# Patient Record
Sex: Male | Born: 1955
Health system: Southern US, Community
[De-identification: ages and names within clinical notes are randomized; demographics above are authoritative.]

## PROBLEM LIST (undated history)

## (undated) DIAGNOSIS — G4730 Sleep apnea, unspecified: Secondary | ICD-10-CM

## (undated) DIAGNOSIS — M549 Dorsalgia, unspecified: Secondary | ICD-10-CM

## (undated) DIAGNOSIS — F419 Anxiety disorder, unspecified: Secondary | ICD-10-CM

## (undated) HISTORY — PX: NOSE SURGERY: SHX723

## (undated) HISTORY — PX: NASAL SEPTUM SURGERY: SHX37

## (undated) HISTORY — DX: Anxiety disorder, unspecified: F41.9

## (undated) HISTORY — DX: Dorsalgia, unspecified: M54.9

## (undated) HISTORY — DX: Sleep apnea, unspecified: G47.30

---

## 2005-05-30 ENCOUNTER — Encounter: Admission: RE | Admit: 2005-05-30 | Discharge: 2005-05-30 | Payer: Self-pay | Admitting: Occupational Medicine

## 2007-07-31 ENCOUNTER — Ambulatory Visit (HOSPITAL_COMMUNITY): Admission: RE | Admit: 2007-07-31 | Discharge: 2007-07-31 | Payer: Self-pay | Admitting: General Surgery

## 2011-01-22 NOTE — Op Note (Signed)
Willie Ortega, Willie Ortega              ACCOUNT NO.:  [id number]   MEDICAL RECORD NO.:  [id number]          PATIENT TYPE:  AMB   LOCATION:  DAY                           FACILITY:  [facility]   PHYSICIAN:  Dalia Heading, M.D.  DATE OF BIRTH:  [birth year]   DATE OF PROCEDURE:  07/31/2007  DATE OF DISCHARGE:                               OPERATIVE REPORT   PREOPERATIVE DIAGNOSIS:  Need for screening colonoscopy.   POSTOPERATIVE DIAGNOSIS:  Need for screening colonoscopy, sigmoid  diverticulosis.   PROCEDURE:  Colonoscopy.   SURGEON:  Dalia Heading, M.D.   ANESTHESIA:  Demerol 50 mg IV, Versed 4 mg IV.   INDICATIONS:  The patient is a 55 year old white male who is referred  for endoscopic evaluation.  The risks and benefits of the procedure,  including bleeding and perforation, were fully explained to the patient  who gave informed consent.   PROCEDURE NOTE:  The patient was placed in the left lateral decubitus  position after monitored anesthesia care was given.  Surgical site  confirmation was performed.  A rectal examination was unremarkable.   The endoscope was advanced to the cecum without difficulty.  Confirmation of placement to the cecum was done using transabdominal  palpation and landmarks.  The bowel preparation was adequate.  The  cecum, ascending colon, transverse colon, and descending colon regions  were all within normal limits.  No abnormal lesions were noted.  Diverticulosis was noted within the sigmoid colon.  There was no  evidence of previous infection or stricture formation.  The rectum was  within normal limits.  No abnormal lesions were noted.  On retroflexion  of the endoscope, the dentate line was inspected and noted to be within  normal limits.  The colonoscope was returned to the neutral position,  and all air was evacuated from the colon and rectum prior to removal of  the endoscope.   The patient tolerated the procedure well and was transferred back to  day  surgery in stable condition.   COMPLICATIONS:  None.   SPECIMENS:  None.   ESTIMATED BLOOD LOSS:  None.   RECOMMENDATIONS:  A followup colonoscopy is recommended in 10 years for  screening purposes.      Dalia Heading, M.D.  Electronically Signed     MAJ/MEDQ  D:  07/31/2007  T:  07/31/2007  Job:  [id number]   cc:   Patrica Duel, M.D.  Fax: [phone]

## 2011-01-22 NOTE — H&P (Signed)
Willie Ortega, Willie Ortega              ACCOUNT NO.:  [id number]   MEDICAL RECORD NO.:  [id number]          PATIENT TYPE:  AMB   LOCATION:  DAY                           FACILITY:  [facility]   PHYSICIAN:  Dalia Heading, M.D.  DATE OF BIRTH:  05-18-1956   DATE OF ADMISSION:  DATE OF DISCHARGE:  LH                              HISTORY & PHYSICAL   CHIEF COMPLAINT:  Need for screening colonoscopy.   HISTORY OF PRESENT ILLNESS:  Patient is a 55 year old white male who is  referred for endoscopic evaluation.  He needs colonoscopy for screening  purposes.  No abdominal pain, weight loss, nausea, vomiting, diarrhea,  constipation, melena, or hematochezia have been noted.  He has never had  a colonoscopy.  There is no family history of colon carcinoma.   PAST MEDICAL HISTORY:  Unremarkable.   PAST SURGICAL HISTORY:  Unremarkable.   CURRENT MEDICATIONS:  Simvastatin.   ALLERGIES:  NO KNOWN DRUG ALLERGIES.   REVIEW OF SYSTEMS:  Noncontributory.   PHYSICAL EXAMINATION:  Patient is a well-developed, well-nourished white  male in no acute distress.  LUNGS:  Clear to auscultation with equal breath sounds bilaterally.  HEART:  Examination reveals a regular rate and rhythm without S3, S4, or  murmurs.  ABDOMEN:  Soft, nontender, nondistended.  No hepatosplenomegaly or  masses are noted.  RECTAL:  Examination was deferred to the procedure.   IMPRESSION:  Need for screening colonoscopy.   PLAN:  The patient is scheduled for a colonoscopy on July 31, 2007.  The risks and benefits of the procedure including bleeding and  perforation were fully explained to the patient, gave informed consent.      Dalia Heading, M.D.  Electronically Signed     MAJ/MEDQ  D:  07/23/2007  T:  07/24/2007  Job:  [id number]   cc:   Dalia Heading, M.D.  Fax: [phone]   Patrica Duel, M.D.  Fax: [phone]

## 2015-01-06 ENCOUNTER — Ambulatory Visit (INDEPENDENT_AMBULATORY_CARE_PROVIDER_SITE_OTHER): Payer: BLUE CROSS/BLUE SHIELD | Admitting: Neurology

## 2015-01-06 ENCOUNTER — Encounter: Payer: Self-pay | Admitting: Neurology

## 2015-01-06 VITALS — BP 118/70 | HR 72 | Resp 16 | Ht 74.0 in | Wt 268.0 lb

## 2015-01-06 DIAGNOSIS — E669 Obesity, unspecified: Secondary | ICD-10-CM

## 2015-01-06 DIAGNOSIS — G4733 Obstructive sleep apnea (adult) (pediatric): Secondary | ICD-10-CM

## 2015-01-06 DIAGNOSIS — G4719 Other hypersomnia: Secondary | ICD-10-CM | POA: Diagnosis not present

## 2015-01-06 NOTE — Progress Notes (Signed)
Subjective:    Patient ID: Willie Ortega is a 59 y.o. male.  HPI     Huston Foley, MD, PhD [facility] [address] [address] [place], [place] [address]  Dear Sharlet Salina,    I saw your patient, Willie Ortega, upon your kind request in my neurologic clinic today for initial consultation of his sleep disorder, in particular, reevaluation of his prior obstructive sleep apnea diagnosis, in the context of weight gain, and excessive daytime somnolence reported. The patient is unaccompanied today. As you know, Willie Ortega is a 59 year old right-handed gentleman with an underlying medical history of anxiety, low back pain and obesity, who was diagnosed with obstructive sleep apnea several years ago. His last sleep study was about 12 years ago and he was advised to use CPAP at the time. He has been on CPAP therapy for about 12 years now. His machine is old. He has a tendency to take his mask off in the early morning hours and has not been very good about putting it back on. He does use it every night as far as putting the mask on. For the longest time he has been using a nasal mask but some 4 or 5 months ago he started using a nasal pillows mask which he prefers. He reports a pressure of 16 cm. As far as he can remember he was diagnosed with significant obstructive sleep apnea. I do not have test results from his previous sleep study available for review today. He reports an approximate 15 pound weight gain since his original sleep study. He would like to get an updated machine and updated settings. He does endorse daytime somnolence with an Epworth sleepiness score of 14 out of 24 today. He denies restless leg symptoms but does have occasional leg cramps at night. He may not be drinking enough water he endorses. He Drinks coffee occasionally maybe once or twice per week. He does not smoke. He drinks alcohol occasionally. He likes to nap if he can. Bedtime usually is 9:51  PM he falls asleep fairly quickly. He lives alone and sleeps alone typically. His wake time is 5 AM and he does not always wake up rested. He denies morning headaches or significant nocturia. He is not aware of any family history of RLS or obstructive sleep apnea. 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 He gets his supplies from [employer] in [place].  Some 20 years ago he had nasal surgery. From what I can tell, he had septum deviation repair and inferior turbinate reduction. He does suffer from allergies and nasal congestion. He takes Claritin occasionally. He has used nasal saline rinses. He denies using Afrin.   His Past Medical History Is Significant For: Past Medical History  Diagnosis Date  . Anxiety   . Sleep apnea   . Back pain     His Past Surgical History Is Significant For: Past Surgical History  Procedure Laterality Date  . Nose surgery      His Family History Is Significant For: Family History  Problem Relation Age of Onset  . Hypertension Mother   . Hyperlipidemia Mother   . Thyroid cancer Sister     His Social History Is Significant For: History   Social History  . Marital Status: Single    Spouse Name: N/A  . Number of Children: N/A  . Years of Education: N/A   Social History Main Topics  . Smoking status:  Never Smoker   . Smokeless tobacco: Not on file  . Alcohol Use: 0.0 oz/week    0 Standard drinks or equivalent per week     Comment: 3-4 drinks a week   . Drug Use: No  . Sexual Activity: Not on file   Other Topics Concern  . None   Social History Narrative   1-2  Caffeine drinks a week    His Allergies Are:  No Known Allergies:   His Current Medications Are:  Outpatient Encounter Prescriptions as of 01/06/2015  Medication Sig  . aspirin EC 81 MG tablet Take 81 mg by mouth daily.  . diazepam (VALIUM) 10 MG tablet Take 10 mg by mouth 3 (three) times daily as needed.  :  Review of Systems:   Out of a complete 14 point review of systems, all are reviewed and negative with the exception of these symptoms as listed below:   Review of Systems  Respiratory:       Snoring   Neurological:       Sleepiness, Last sleep study done 12 years ago- diagnosed with sleep apnea   Psychiatric/Behavioral:       Not enough sleep     Objective:  Neurologic Exam  Physical Exam Physical Examination:   Filed Vitals:   01/06/15 0933  BP: 118/70  Pulse: 72  Resp: 16    General Examination: The patient is a very pleasant 59 y.o. male in no acute distress. He appears well-developed and well-nourished and well groomed.   HEENT: Normocephalic, atraumatic, pupils are equal, round and reactive to light and accommodation. Funduscopic exam is normal with sharp disc margins noted. Extraocular tracking is good without limitation to gaze excursion or nystagmus noted. Normal smooth pursuit is noted. Hearing is grossly intact. Tympanic membranes are clear bilaterally. Face is symmetric with normal facial animation and normal facial sensation. Speech is clear with no dysarthria noted. There is no hypophonia. There is no lip, neck/head, jaw or voice tremor. Neck is supple with full range of passive and active motion. There are no carotid bruits on auscultation. Oropharynx exam reveals: mild mouth dryness, good dental hygiene and moderate airway crowding, due to redundant soft palate and wider tongue. Mallampati is class II. Tongue protrudes centrally and palate elevates symmetrically. Tonsils are small. Neck size is 18.75 inches. He has a Mild overbite. Nasal inspection reveals no significant nasal mucosal bogginess or redness and no septal deviation, but he does have narrow nasal passages bilaterally.   Chest: Clear to auscultation without wheezing, rhonchi or crackles noted.  Heart: S1+S2+0, regular and normal without murmurs, rubs or gallops noted.   Abdomen: Soft, non-tender and non-distended with normal  bowel sounds appreciated on auscultation.  Extremities: There is trace pitting edema in the distal lower extremities bilaterally. Pedal pulses are intact.  Skin: Warm and dry without trophic changes noted. There are no varicose veins.  Musculoskeletal: exam reveals no obvious joint deformities, tenderness or joint swelling or erythema.   Neurologically:  Mental status: The patient is awake, alert and oriented in all 4 spheres. His immediate and remote memory, attention, language skills and fund of knowledge are appropriate. There is no evidence of aphasia, agnosia, apraxia or anomia. Speech is clear with normal prosody and enunciation. Thought process is linear. Mood is normal and affect is normal.  Cranial nerves II - XII are as described above under HEENT exam. In addition: shoulder shrug is normal with equal shoulder height noted. Motor exam: Normal bulk, strength and  tone is noted. There is no drift, tremor or rebound. Romberg is negative. Reflexes are 2+ throughout. Babinski: Toes are flexor bilaterally. Fine motor skills and coordination: intact with normal finger taps, normal hand movements, normal rapid alternating patting, normal foot taps and normal foot agility.  Cerebellar testing: No dysmetria or intention tremor on finger to nose testing. Heel to shin is unremarkable bilaterally. There is no truncal or gait ataxia.  Sensory exam: intact to light touch, pinprick, vibration, temperature sense in the upper and lower extremities.  Gait, station and balance: He stands easily. No veering to one side is noted. No leaning to one side is noted. Posture is age-appropriate and stance is narrow based. Gait shows normal stride length and normal pace. No problems turning are noted. He turns en bloc. Tandem walk is unremarkable.   Assessment and Plan:  In summary, Willie Guileommy D Talmadge [place] is a very pleasant 59 y.o.-year old male with an underlying medical history of anxiety, low back pain and obesity, who  was diagnosed with obstructive sleep apnea several years ago. He reports interim weight gain and significant daytime somnolence and does not always keep his mask on. He would benefit from reevaluation as his study was over 10 years ago and his machine is over 58 years old. He would need a new machine as well at this time. I had a long chat with the patient about my findings and the diagnosis of OSA, its prognosis and treatment options. We talked about medical treatments, surgical interventions and non-pharmacological approaches. I explained in particular the risks and ramifications of untreated moderate to severe OSA, especially with respect to developing cardiovascular disease down the Road, including congestive heart failure, difficult to treat hypertension, cardiac arrhythmias, or stroke. Even type 2 diabetes has, in part, been linked to untreated OSA. Symptoms of untreated OSA include daytime sleepiness, memory problems, mood irritability and mood disorder such as depression and anxiety, lack of energy, as well as recurrent headaches, especially morning headaches. We talked about trying to maintain a healthy lifestyle in general, as well as the importance of weight control. I encouraged the patient to eat healthy, exercise daily and keep well hydrated, to keep a scheduled bedtime and wake time routine, to not skip any meals and eat healthy snacks in between meals. I advised the patient not to drive when feeling sleepy. I recommended the following at this time: sleep study with potential positive airway pressure titration. (We will score hypopneas at 3% and split the sleep study into diagnostic and treatment portion, if the estimated. 2 hour AHI is >15/h).   I explained the sleep test procedure to the patient and also outlined possible surgical and non-surgical treatment options of OSA, including the use of a custom-made dental device (which would require a referral to a specialist dentist or oral surgeon),  upper airway surgical options, such as pillar implants, radiofrequency surgery, tongue base surgery, and UPPP (which would involve a referral to an ENT surgeon). Rarely, jaw surgery such as mandibular advancement may be considered.  I also explained the CPAP treatment option to the patient, who indicated that he would be willing to continue with CPAP therapy the need arises. It sounds like he required a fairly high pressure at the time. In light of his weight gain there is a chance that he may require an even higher pressure at this point. He may benefit from bilevel positive airway pressure in that instance. I explained the importance of being compliant with PAP treatment, not  only for insurance purposes but primarily to improve His symptoms, and for the patient's long term health benefit, including to reduce His cardiovascular risks. I answered all his questions today and the patient was in agreement. I would like to see him back after the sleep study is completed and encouraged him to call with any interim questions, concerns, problems or updates.   Thank you very much for allowing me to participate in the care of this nice patient. If I can be of any further assistance to you please do not hesitate to call me at [phone].  Sincerely,   Star Age, MD, PhD

## 2015-01-06 NOTE — Patient Instructions (Signed)

## 2015-01-19 ENCOUNTER — Ambulatory Visit (INDEPENDENT_AMBULATORY_CARE_PROVIDER_SITE_OTHER): Payer: BLUE CROSS/BLUE SHIELD | Admitting: Neurology

## 2015-01-19 VITALS — BP 126/76 | HR 75 | Ht 74.0 in | Wt 268.0 lb

## 2015-01-19 DIAGNOSIS — G4761 Periodic limb movement disorder: Secondary | ICD-10-CM

## 2015-01-19 DIAGNOSIS — G4733 Obstructive sleep apnea (adult) (pediatric): Secondary | ICD-10-CM | POA: Diagnosis not present

## 2015-01-19 DIAGNOSIS — G479 Sleep disorder, unspecified: Secondary | ICD-10-CM

## 2015-01-19 DIAGNOSIS — G4710 Hypersomnia, unspecified: Secondary | ICD-10-CM

## 2015-01-19 DIAGNOSIS — G4730 Sleep apnea, unspecified: Secondary | ICD-10-CM

## 2015-01-20 NOTE — Sleep Study (Signed)
See scanned document in Encounters tab 

## 2015-01-31 ENCOUNTER — Telehealth: Payer: Self-pay | Admitting: Neurology

## 2015-01-31 DIAGNOSIS — G4733 Obstructive sleep apnea (adult) (pediatric): Secondary | ICD-10-CM

## 2015-01-31 NOTE — Telephone Encounter (Signed)
Lafonda MossesDiana:   Please call and notify patient that the recent sleep study confirmed the diagnosis of severe OSA. He did well with CPAP during the study with significant improvement of the respiratory events. Therefore, I would like re-start/keep the patient on CPAP therapy at home by prescribing a NEW CPAP machine for home use. I placed the order in the chart. The patient will need a follow up appointment with me in 8 to 10 weeks post set up that has to be scheduled; please go ahead and schedule while you have the patient on the phone and make sure patient understands the importance of keeping this window for the FU appointment, as it is often an insurance requirement and failing to adhere to this may result in losing coverage for sleep apnea treatment. 15 min follow-up should suffice, unless there is a 30 min FU slot available.  Please re-enforce the importance of compliance with treatment and the need for us to monitor compliance data - again an insurance requirement and good feedback for the patient as far as how they are doing.  Also remind patient, that any upcoming CPAP machine or mask issues, should be first addressed with the DME company. Please ask if patient has a preference regarding DME company.  Please arrange for CPAP set up at home through a DME company of patient's choice - once you have spoken to the patient - and faxed/routed report to PCP and referring MD (if other than PCP), you can close this encounter, thanks,   He may have an existing DME company - Ins: [employer]  Huston FoleySaima Disaya Walt, MD, PhD [facility] ([facility])

## 2015-02-01 NOTE — Telephone Encounter (Signed)
I spoke to patient. He is aware of results. He would like to proceed with getting new CPAP. He requested [facility]. He will call back to make f/u appt. I will also send patient a letter to remind him to make appt and explain the importance of compliance.

## 2015-04-10 ENCOUNTER — Encounter: Payer: Self-pay | Admitting: Neurology

## 2015-04-10 ENCOUNTER — Ambulatory Visit (INDEPENDENT_AMBULATORY_CARE_PROVIDER_SITE_OTHER): Payer: BLUE CROSS/BLUE SHIELD | Admitting: Neurology

## 2015-04-10 ENCOUNTER — Telehealth: Payer: Self-pay | Admitting: Neurology

## 2015-04-10 VITALS — BP 124/80 | HR 78 | Resp 18 | Ht 74.0 in | Wt 271.0 lb

## 2015-04-10 DIAGNOSIS — Z9989 Dependence on other enabling machines and devices: Secondary | ICD-10-CM

## 2015-04-10 DIAGNOSIS — E669 Obesity, unspecified: Secondary | ICD-10-CM | POA: Diagnosis not present

## 2015-04-10 DIAGNOSIS — G4733 Obstructive sleep apnea (adult) (pediatric): Secondary | ICD-10-CM

## 2015-04-10 NOTE — Patient Instructions (Addendum)

## 2015-04-10 NOTE — Progress Notes (Signed)
Subjective:    Patient ID: Willie Ortega is a 59 y.o. male.  HPI     Interim history:   Mr. Willie Ortega is a 59 year old right-handed gentleman with an underlying medical history of anxiety, low back pain and obesity, who presents for follow-up consultation of his severe obstructive sleep apnea, after his recent sleep study. The patient is unaccompanied today. I first met him on 01/06/2015 at the request of his primary care provider, at which time the patient reported a previous diagnosis of obstructive sleep apnea. His last study was over 12 years ago when he had an old CPAP machine and needed reevaluation. I invited him back for a sleep study. He had a split-night sleep study on 01/19/2015 underwent over his test results with him in detail today. Sleep efficiency at baseline was 84.7% with a latency to sleep of 2.5 minutes and wake after sleep onset of 18.5 minutes with mild to moderate sleep fragmentation noted. He had a markedly elevated arousal index secondary to respiratory events. He had absence of slow-wave sleep and REM sleep prior to CPAP initiation. He had no significant PLMS, EKG or EEG changes. He had mild to moderate snoring. Total AHI was elevated at 71.1 per hour, baseline oxygen saturation 89% only, nadir of 74%. He was then titrated on CPAP. Sleep efficiency was 95.5%, arousal index normal, he achieved significant slow-wave sleep and REM sleep after CPAP was initiated. Average oxygen saturation improved at 91% with a nadir of 81%. Mild PLMS were noted without arousals. CPAP was titrated from 6-12 cm. His AHI was reduced to 6 per hour at the final pressure with supine REM sleep achieved during the study. Based on his test results and residual mild sleep disordered breathing noted on the final pressure, I prescribed CPAP at 13 cm via nasal pillows.   Today, 04/10/2015: I reviewed his CPAP compliance data from 03/11/2015 through 04/09/2015 which is a total of 30 days during which time he  used his machine 28 days with percent used days greater than 4 hours of 73%, indicating adequate compliance, with an average usage of 4 hours and 58 minutes for all nights, residual AHI at 1.8 per hour, leak at times high with the 95th percentile at 38.4 L/m on a pressure of 13 cm.  Today, 04/10/2015: He reports doing fairly well. He has adjusted well to the new mask and pressure. He is compliant with treatment. He has not been using his chin strap. Previously was using a chinstrap. Sometimes he wakes up and he has pulled off the mask from his face. He has not been very good about his weight loss lately. He has gained a few pounds. He has no new complaints. In fact, he feels better, sleeping better and having less daytime somnolence.  Previously:   The patient was diagnosed with obstructive sleep apnea several years ago. His last sleep study was about 12 years ago and he was advised to use CPAP at the time. He has been on CPAP therapy for about 12 years now. His machine is old. He has a tendency to take his mask off in the early morning hours and has not been very good about putting it back on. He does use it every night as far as putting the mask on. For the longest time he has been using a nasal mask but some 4 or 5 months ago he started using a nasal pillows mask which he prefers. He reports a pressure of 16 cm. As far  as he can remember he was diagnosed with significant obstructive sleep apnea. I do not have test results from his previous sleep study available for review today. He reports an approximate 15 pound weight gain since his original sleep study. He would like to get an updated machine and updated settings. He does endorse daytime somnolence with an Epworth sleepiness score of 14 out of 24 today. He denies restless leg symptoms but does have occasional leg cramps at night. He may not be drinking enough water he endorses. He Drinks coffee occasionally maybe once or twice per week. He does not  smoke. He drinks alcohol occasionally. He likes to nap if he can. Bedtime usually is 9:51 PM he falls asleep fairly quickly. He lives alone and sleeps alone typically. His wake time is 5 AM and he does not always wake up rested. He denies morning headaches or significant nocturia. He is not aware of any family history of RLS or obstructive sleep apnea. 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 He gets his supplies from [employer] in [place].   Some 20 years ago he had nasal surgery. From what I can tell, he had septum deviation repair and inferior turbinate reduction. He does suffer from allergies and nasal congestion. He takes Claritin occasionally. He has used nasal saline rinses. He denies using Afrin.    His Past Medical History Is Significant For: Past Medical History  Diagnosis Date  . Anxiety   . Sleep apnea   . Back pain     Her Past Surgical History Is Significant For: Past Surgical History  Procedure Laterality Date  . Nose surgery      His Family History Is Significant For: Family History  Problem Relation Age of Onset  . Hypertension Mother   . Hyperlipidemia Mother   . Thyroid cancer Sister     His Social History Is Significant For: History   Social History  . Marital Status: Single    Spouse Name: N/A  . Number of Children: N/A  . Years of Education: N/A   Social History Main Topics  . Smoking status: Never Smoker   . Smokeless tobacco: Not on file  . Alcohol Use: 0.0 oz/week    0 Standard drinks or equivalent per week     Comment: 3-4 drinks a week   . Drug Use: No  . Sexual Activity: Not on file   Other Topics Concern  . None   Social History Narrative   1-2  Caffeine drinks a week     His Allergies Are:  No Known Allergies:   His Current Medications Are:  Outpatient Encounter Prescriptions as of 04/10/2015  Medication Sig  . aspirin EC 81 MG tablet Take 81 mg by mouth daily.  . diazepam (VALIUM)  10 MG tablet Take 10 mg by mouth 3 (three) times daily as needed.   No facility-administered encounter medications on file as of 04/10/2015.  :  Review of Systems:  Out of a complete 14 point review of systems, all are reviewed and negative with the exception of these symptoms as listed below:   Review of Systems  Neurological:       Patient feels like he is doing well on CPAP    Objective:  Neurologic Exam  Physical Exam Physical Examination:   Filed Vitals:   04/10/15 1506  BP: 124/80  Pulse: 78  Resp: 18    General Examination: The patient is a very pleasant  59 y.o. male in no acute distress. He appears well-developed and well-nourished and well groomed.   HEENT: Normocephalic, atraumatic, pupils are equal, round and reactive to light and accommodation. Funduscopic exam is normal with sharp disc margins noted. Extraocular tracking is good without limitation to gaze excursion or nystagmus noted. Normal smooth pursuit is noted. Hearing is grossly intact. Face is symmetric with normal facial animation and normal facial sensation. Speech is clear with no dysarthria noted. There is no hypophonia. There is no lip, neck/head, jaw or voice tremor. Neck is supple with full range of passive and active motion. There are no carotid bruits on auscultation. Oropharynx exam reveals: mild mouth dryness, good dental hygiene and moderate airway crowding, due to redundant soft palate and wider tongue. Mallampati is class II. Tongue protrudes centrally and palate elevates symmetrically. Tonsils are small. He has a Mild overbite. Nasal inspection reveals no significant nasal mucosal bogginess or redness and no septal deviation and no irritation noted at the nares.    Chest: Clear to auscultation without wheezing, rhonchi or crackles noted.  Heart: S1+S2+0, regular and normal without murmurs, rubs or gallops noted.   Abdomen: Soft, non-tender and non-distended with normal bowel sounds appreciated on  auscultation.  Extremities: There is trace pitting edema in the distal lower extremities bilaterally. Pedal pulses are intact.  Skin: Warm and dry without trophic changes noted. There are no varicose veins.  Musculoskeletal: exam reveals no obvious joint deformities, tenderness or joint swelling or erythema.   Neurologically:  Mental status: The patient is awake, alert and oriented in all 4 spheres. His immediate and remote memory, attention, language skills and fund of knowledge are appropriate. There is no evidence of aphasia, agnosia, apraxia or anomia. Speech is clear with normal prosody and enunciation. Thought process is linear. Mood is normal and affect is normal.  Cranial nerves II - XII are as described above under HEENT exam. In addition: shoulder shrug is normal with equal shoulder height noted. Motor exam: Normal bulk, strength and tone is noted. There is no drift, tremor or rebound. Romberg is negative. Reflexes are 2+ throughout. Fine motor skills and coordination: intact.  Cerebellar testing: No dysmetria or intention tremor on finger to nose testing. Heel to shin is unremarkable bilaterally. There is no truncal or gait ataxia.  Sensory exam: intact to light touch, pinprick, vibration, temperature sense in the upper and lower extremities.  Gait, station and balance: He stands easily. No veering to one side is noted. No leaning to one side is noted. Posture is age-appropriate and stance is narrow based. Gait shows normal stride length and normal pace. No problems turning are noted. He turns en bloc. Tandem walk is unremarkable.   Assessment and Plan:  In summary, Nathaneil Feagans Reineck is a very pleasant 59 year old male with an underlying medical history of anxiety, low back pain and obesity, who was diagnosed with obstructive sleep apnea several years ago. He reported interim weight gain and recurrence of snoring as well as daytime somnolence. He had a split-night sleep study in May 2016  which confirmed the diagnosis of severe obstructive sleep apnea, I prescribed new CPAP mask and machine setting for him. We were able to get him a new machine but his insurance apparently did not cover the machine, just a supplies. At any rate, he has been able to get a new machine. He is compliant with treatment. Sometimes he takes the mask off in the middle of the night and does not realize it.  He actually feels improved and his daytime somnolence and sleep quality. I will prescribe a chinstrap because this is with a leak may be coming from. Pressure seems to be adequate at 13 cm at this time. I again explained to him the risks and ramifications of untreated obstructive sleep apnea, especially when it is severe, as it can cause cardiovascular complications down the road. He is motivated to continue with treatment. He is congratulated on his treatment adherence and encouraged to try to pursue weight loss more aggressively. I explained the importance of being compliant with PAP treatment, not only for insurance purposes but primarily to improve His symptoms, and for the patient's long term health benefit, including to reduce His cardiovascular risks. I would like to see him back in 6 months, sooner if the need arises. I answered all his questions today and he was in agreement. I spent 20 minutes in total face-to-face time with the patient, more than 50% of which was spent in counseling and coordination of care, reviewing test results, reviewing medication and discussing or reviewing the diagnosis of OSA, its prognosis and treatment options.

## 2015-04-10 NOTE — Telephone Encounter (Signed)
Toya with [facility] can not download until he comes in with SD card from CPAP machine. She doesn't know what type of machine he has as he did not get it from them. She has left message for him to call her. She can be reached at [phone].

## 2015-10-13 ENCOUNTER — Ambulatory Visit: Payer: BLUE CROSS/BLUE SHIELD | Admitting: Neurology

## 2015-10-19 ENCOUNTER — Encounter: Payer: Self-pay | Admitting: Neurology

## 2015-10-19 ENCOUNTER — Ambulatory Visit (INDEPENDENT_AMBULATORY_CARE_PROVIDER_SITE_OTHER): Payer: BLUE CROSS/BLUE SHIELD | Admitting: Neurology

## 2015-10-19 VITALS — BP 132/68 | HR 78 | Resp 18 | Ht 74.0 in | Wt 277.0 lb

## 2015-10-19 DIAGNOSIS — G4733 Obstructive sleep apnea (adult) (pediatric): Secondary | ICD-10-CM | POA: Diagnosis not present

## 2015-10-19 DIAGNOSIS — E669 Obesity, unspecified: Secondary | ICD-10-CM

## 2015-10-19 DIAGNOSIS — R635 Abnormal weight gain: Secondary | ICD-10-CM | POA: Diagnosis not present

## 2015-10-19 DIAGNOSIS — Z9989 Dependence on other enabling machines and devices: Secondary | ICD-10-CM

## 2015-10-19 NOTE — Patient Instructions (Signed)
We have turned off the ramp.   Please use the humidifier and distilled water in it. Mouth dryness may be why you pull off the mask in the middle of the night.   Please continue using your CPAP regularly. While your insurance requires that you use CPAP at least 4 hours each night on 70% of the nights, I recommend, that you not skip any nights and use it throughout the night if you can. Getting used to CPAP and staying with the treatment long term does take time and patience and discipline. Untreated obstructive sleep apnea when it is moderate to severe can have an adverse impact on cardiovascular health and raise her risk for heart disease, arrhythmias, hypertension, congestive heart failure, stroke and diabetes. Untreated obstructive sleep apnea causes sleep disruption, nonrestorative sleep, and sleep deprivation. This can have an impact on your day to day functioning and cause daytime sleepiness and impairment of cognitive function, memory loss, mood disturbance, and problems focussing. Using CPAP regularly can improve these symptoms.

## 2015-10-19 NOTE — Progress Notes (Signed)
Subjective:    Patient ID: Willie Ortega is a 60 y.o. male.  HPI     Interim history:   Willie Ortega is a 60 year old right-handed gentleman with an underlying medical history of anxiety, low back pain and obesity, who presents for follow-up consultation of his severe obstructive sleep apnea, on treatment with CPAP. The patient is unaccompanied today. I last saw him on 04/10/2015, at which time he reported doing fairly well. He had adjusted reasonably well to CPAP therapy. He was adequately compliant with treatment. He had gained a few pounds. He did indicate sleeping better and having less daytime somnolence with CPAP therapy.  Today, 10/19/2015: I reviewed his CPAP compliance data from 09/18/2015 through 10/17/2015 which is a total of 30 days during which time he used his machine 21 days with percent used days greater than 4 hours at home today for 47%, indicating suboptimal compliance with an average usage of 3 hours and 12 minutes, residual AHI at 1.3 per hour, leak borderline for the 95th percentile at 25.5 L/m on a pressure of 13 cm.   Today, 10/19/2015: He reports that he was not fully using his CPAP machine recently because he had a stomach virus a couple of months ago when he had bronchitis a few weeks ago. Also he was out of town son and did not take his machine with him. He typically does not use the humidifier. He still has a tendency to pull the mask off in the middle of the night and not realize it. He does not like the ramp time. He has gained some weight.  Previously:  I first met him on 01/06/2015 at the request of his primary care provider, at which time the patient reported a previous diagnosis of obstructive sleep apnea. His last study was over 12 years ago when he had an old CPAP machine and needed reevaluation. I invited him back for a sleep study. He had a split-night sleep study on 01/19/2015 underwent over his test results with him in detail today. Sleep efficiency at  baseline was 84.7% with a latency to sleep of 2.5 minutes and wake after sleep onset of 18.5 minutes with mild to moderate sleep fragmentation noted. He had a markedly elevated arousal index secondary to respiratory events. He had absence of slow-wave sleep and REM sleep prior to CPAP initiation. He had no significant PLMS, EKG or EEG changes. He had mild to moderate snoring. Total AHI was elevated at 71.1 per hour, baseline oxygen saturation 89% only, nadir of 74%. He was then titrated on CPAP. Sleep efficiency was 95.5%, arousal index normal, he achieved significant slow-wave sleep and REM sleep after CPAP was initiated. Average oxygen saturation improved at 91% with a nadir of 81%. Mild PLMS were noted without arousals. CPAP was titrated from 6-12 cm. His AHI was reduced to 6 per hour at the final pressure with supine REM sleep achieved during the study. Based on his test results and residual mild sleep disordered breathing noted on the final pressure, I prescribed CPAP at 13 cm via nasal pillows.   I reviewed his CPAP compliance data from 03/11/2015 through 04/09/2015 which is a total of 30 days during which time he used his machine 28 days with percent used days greater than 4 hours of 73%, indicating adequate compliance, with an average usage of 4 hours and 58 minutes for all nights, residual AHI at 1.8 per hour, leak at times high with the 95th percentile at 38.4 L/m on a  pressure of 13 cm.  The patient was diagnosed with obstructive sleep apnea several years ago. His last sleep study was about 12 years ago and he was advised to use CPAP at the time. He has been on CPAP therapy for about 12 years now. His machine is old. He has a tendency to take his mask off in the early morning hours and has not been very good about putting it back on. He does use it every night as far as putting the mask on. For the longest time he has been using a nasal mask but some 4 or 5 months ago he started using a nasal pillows  mask which he prefers. He reports a pressure of 16 cm. As far as he can remember he was diagnosed with significant obstructive sleep apnea. I do not have test results from his previous sleep study available for review today. He reports an approximate 15 pound weight gain since his original sleep study. He would like to get an updated machine and updated settings. He does endorse daytime somnolence with an Epworth sleepiness score of 14 out of 24 today. He denies restless leg symptoms but does have occasional leg cramps at night. He may not be drinking enough water he endorses. He Drinks coffee occasionally maybe once or twice per week. He does not smoke. He drinks alcohol occasionally. He likes to nap if he can. Bedtime usually is 9:51 PM he falls asleep fairly quickly. He lives alone and sleeps alone typically. His wake time is 5 AM and he does not always wake up rested. He denies morning headaches or significant nocturia. He is not aware of any family history of RLS or obstructive sleep apnea. 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 He gets his supplies from [employer] in [place].   Some 20 years ago he had nasal surgery. From what I can tell, he had septum deviation repair and inferior turbinate reduction. He does suffer from allergies and nasal congestion. He takes Claritin occasionally. He has used nasal saline rinses. He denies using Afrin.    His Past Medical History Is Significant For: Past Medical History  Diagnosis Date  . Anxiety   . Sleep apnea   . Back pain     His Past Surgical History Is Significant For: Past Surgical History  Procedure Laterality Date  . Nose surgery      His Family History Is Significant For: Family History  Problem Relation Age of Onset  . Hypertension Mother   . Hyperlipidemia Mother   . Thyroid cancer Sister     His Social History Is Significant For: Social History   Social History  . Marital Status:  Single    Spouse Name: N/A  . Number of Children: N/A  . Years of Education: N/A   Social History Main Topics  . Smoking status: Never Smoker   . Smokeless tobacco: None  . Alcohol Use: 0.0 oz/week    0 Standard drinks or equivalent per week     Comment: 3-4 drinks a week   . Drug Use: No  . Sexual Activity: Not Asked   Other Topics Concern  . None   Social History Narrative   1-2  Caffeine drinks a week     His Allergies Are:  No Known Allergies:   His Current Medications Are:  Outpatient Encounter Prescriptions as of 10/19/2015  Medication Sig  . aspirin EC 81 MG tablet Take 81 mg by  mouth daily.  . diazepam (VALIUM) 10 MG tablet Take 10 mg by mouth 3 (three) times daily as needed.   No facility-administered encounter medications on file as of 10/19/2015.  :  Review of Systems:  Out of a complete 14 point review of systems, all are reviewed and negative with the exception of these symptoms as listed below:   Review of Systems  Neurological:       No new concerns or problems.     Objective:  Neurologic Exam  Physical Exam Physical Examination:   Filed Vitals:   10/19/15 1545  BP: 132/68  Pulse: 78  Resp: 18    General Examination: The patient is a very pleasant 60 y.o. male in no acute distress. He appears well-developed and well-nourished and well groomed.   HEENT: Normocephalic, atraumatic, pupils are equal, round and reactive to light and accommodation. Extraocular tracking is good without limitation to gaze excursion or nystagmus noted. Normal smooth pursuit is noted. Hearing is grossly intact. Face is symmetric with normal facial animation and normal facial sensation. Speech is clear with no dysarthria noted. There is no hypophonia. There is no lip, neck/head, jaw or voice tremor. Neck is supple with full range of passive and active motion. There are no carotid bruits on auscultation. Oropharynx exam reveals: mild mouth dryness, good dental hygiene and  moderate airway crowding, due to redundant soft palate and wider tongue. Mallampati is class II. Tongue protrudes centrally and palate elevates symmetrically. Tonsils are small. He has a Mild overbite. Nasal inspection reveals no significant nasal mucosal bogginess or redness and no septal deviation and no irritation noted at the nares.    Chest: Clear to auscultation without wheezing, rhonchi or crackles noted.  Heart: S1+S2+0, regular and normal without murmurs, rubs or gallops noted.   Abdomen: Soft, non-tender and non-distended with normal bowel sounds appreciated on auscultation.  Extremities: There is trace pitting edema in the distal lower extremities bilaterally, stable. Pedal pulses are intact.  Skin: Warm and dry without trophic changes noted. There are no varicose veins.  Musculoskeletal: exam reveals no obvious joint deformities, tenderness or joint swelling or erythema.   Neurologically:  Mental status: The patient is awake, alert and oriented in all 4 spheres. His immediate and remote memory, attention, language skills and fund of knowledge are appropriate. There is no evidence of aphasia, agnosia, apraxia or anomia. Speech is clear with normal prosody and enunciation. Thought process is linear. Mood is normal and affect is normal.  Cranial nerves II - XII are as described above under HEENT exam. In addition: shoulder shrug is normal with equal shoulder height noted. Motor exam: Normal bulk, strength and tone is noted. There is no drift, tremor or rebound. Romberg is negative. Reflexes are 2+ throughout. Fine motor skills and coordination: intact.  Cerebellar testing: No dysmetria or intention tremor on finger to nose testing. Heel to shin is unremarkable bilaterally. There is no truncal or gait ataxia.  Sensory exam: intact to light touch in the upper and lower extremities.  Gait, station and balance: He stands easily. No veering to one side is noted. No leaning to one side is  noted. Posture is age-appropriate and stance is narrow based. Gait shows normal stride length and normal pace. No problems turning are noted. He turns en bloc. Tandem walk is unremarkable.   Assessment and Plan:  In summary, Willie Ortega is a very pleasant 60 year old male with an underlying medical history of anxiety, low back pain and  obesity, who presents for follow-up consultation of his severe obstructive sleep apnea. He had a split-night sleep study on 01/19/2015. He did well with CPAP therapy at the time. He has not been fully compliant with it. He has also had some interim weight gain. He has been pulling off his mask in the middle of the night. He has not been using the humidifier and is encouraged to add humidifier to it as mouth dryness may be part of the reason why he inadvertently pulls off his mask in the middle of the night. He is advised to pursue weight loss more aggressively. We also turned off the ramp time as he would like to start off with a full pressure. His physical exam is otherwise stable. I prescribed new supplies. His leak actually improved with time. I again reviewed his sleep study results with him briefly today. I explained his compliance data to him. He also endorses that he feels better when he uses his CPAP. He had some recent congestion, stomach virus, and some travel because of which she was not fully compliant. He is advised that he he should take his machine with him when he plans to travel and stay overnight elsewhere. Pressure is adequate at 13 cm at this time. I again explained to him the risks and ramifications of untreated obstructive sleep apnea, especially when it is severe, as it can cause cardiovascular complications down the road. He is motivated to be compliant with treatment. He is commende for trying. I explained the importance of being compliant with PAP treatment, not only for insurance purposes but primarily to improve His symptoms, and for the patient's  long term health benefit, including to reduce His cardiovascular risks. I would like to see him back in 6 months, sooner if the need arises. I answered all his questions today and he was in agreement. I spent 20 minutes in total face-to-face time with the patient, more than 50% of which was spent in counseling and coordination of care, reviewing test results, reviewing medication and discussing or reviewing the diagnosis of OSA, its prognosis and treatment options.

## 2016-04-18 ENCOUNTER — Ambulatory Visit: Payer: BLUE CROSS/BLUE SHIELD | Admitting: Neurology

## 2016-04-29 ENCOUNTER — Ambulatory Visit (INDEPENDENT_AMBULATORY_CARE_PROVIDER_SITE_OTHER): Payer: BLUE CROSS/BLUE SHIELD | Admitting: Neurology

## 2016-04-29 ENCOUNTER — Encounter: Payer: Self-pay | Admitting: Neurology

## 2016-04-29 VITALS — BP 125/75 | HR 76 | Resp 20 | Ht 74.0 in | Wt 275.0 lb

## 2016-04-29 DIAGNOSIS — E669 Obesity, unspecified: Secondary | ICD-10-CM | POA: Diagnosis not present

## 2016-04-29 DIAGNOSIS — Z9989 Dependence on other enabling machines and devices: Secondary | ICD-10-CM

## 2016-04-29 DIAGNOSIS — G4733 Obstructive sleep apnea (adult) (pediatric): Secondary | ICD-10-CM

## 2016-04-29 NOTE — Patient Instructions (Addendum)
I think he would benefit from a mask refit. Please make an appointment with your DME provider, [facility], for a proper mask adjustment, leak indeed appears to be too high from the mask. They may need to resize the nasal pillows.  Otherwise, you are using CPAP quite well, sleep apnea is under control on a pressure of 13 cm of we can continue with the current pressure setting.  Please continue using your CPAP regularly. While your insurance requires that you use CPAP at least 4 hours each night on 70% of the nights, I recommend, that you not skip any nights and use it throughout the night if you can. Getting used to CPAP and staying with the treatment long term does take time and patience and discipline. Untreated obstructive sleep apnea when it is moderate to severe can have an adverse impact on cardiovascular health and raise her risk for heart disease, arrhythmias, hypertension, congestive heart failure, stroke and diabetes. Untreated obstructive sleep apnea causes sleep disruption, nonrestorative sleep, and sleep deprivation. This can have an impact on your day to day functioning and cause daytime sleepiness and impairment of cognitive function, memory loss, mood disturbance, and problems focussing. Using CPAP regularly can improve these symptoms.  Keep up the good work! I will see you back in one year.   Try to lose weight.

## 2016-04-29 NOTE — Progress Notes (Signed)
Subjective:    Patient ID: Willie Ortega is Willie 60 y.o. Ortega.  HPI     Interim history:   Willie Ortega is Willie 60 year old right-handed gentleman with an underlying medical history of anxiety, low back pain and obesity, who presents for follow-up consultation of his severe obstructive sleep apnea, on treatment with CPAP. The patient is unaccompanied today. I last saw Willie Ortega on 10/19/2015, at which time Willie Ortega reported that Willie Ortega was not fully using his CPAP machine recently because Willie Ortega had Willie stomach virus Willie couple of months ago when Willie Ortega had bronchitis Willie few weeks ago. Also Willie Ortega was out of town some and did not take his machine with Willie Ortega. Willie Ortega typically does not use the humidifier. Willie Ortega still has Willie tendency to pull the mask off in the middle of the night and not realize it. Willie Ortega does not like the ramp time. Willie Ortega had gained some weight. We talked about the importance of compliance with CPAP therapy.   Today, 04/29/2016: I reviewed his compliance data from 03/24/2016 through 04/22/2016 which is Willie total of 30 days during which time Willie Ortega used his machine 28 days with percent used days greater than 4 hours at 73%, indicating adequate compliance with an average usage for all days of 4 hours and 55 minutes, residual AHI 1.9 per hour, leak at times high with the 95th percentile at 45.5 L/m on Willie pressure of 13 cm.  Today, 04/29/2016: Willie Ortega reports overall doing well, using CPAP regularly, feels that his mask fits well but his machine does give Willie Ortega an error message about leak being too high. No new medication, does not take his statin. Weight stable, is trying to lose weight. Has knee pain from time to time. No new issues otherwise.   Previously:    I saw Willie Ortega on 04/10/2015, at which time Willie Ortega reported doing fairly well. Willie Ortega had adjusted reasonably well to CPAP therapy. Willie Ortega was adequately compliant with treatment. Willie Ortega had gained Willie few pounds. Willie Ortega did indicate sleeping better and having less daytime somnolence with CPAP therapy.   I reviewed  his CPAP compliance data from 09/18/2015 through 10/17/2015 which is Willie total of 30 days during which time Willie Ortega used his machine 21 days with percent used days greater than 4 hours at home today for 47%, indicating suboptimal compliance with an average usage of 3 hours and 12 minutes, residual AHI at 1.3 per hour, leak borderline for the 95th percentile at 25.5 L/m on Willie pressure of 13 cm.    I first met Willie Ortega on 01/06/2015 at the request of his primary care provider, at which time the patient reported Willie previous diagnosis of obstructive sleep apnea. His last study was over 12 years ago when Willie Ortega had an old CPAP machine and needed reevaluation. I invited Willie Ortega back for Willie sleep study. Willie Ortega had Willie split-night sleep study on 01/19/2015 underwent over his test results with Willie Ortega in detail today. Sleep efficiency at baseline was 84.7% with Willie latency to sleep of 2.5 minutes and wake after sleep onset of 18.5 minutes with mild to moderate sleep fragmentation noted. Willie Ortega had Willie markedly elevated arousal index secondary to respiratory events. Willie Ortega had absence of slow-wave sleep and REM sleep prior to CPAP initiation. Willie Ortega had no significant PLMS, EKG or EEG changes. Willie Ortega had mild to moderate snoring. Total AHI was elevated at 71.1 per hour, baseline oxygen saturation 89% only, nadir of 74%. Willie Ortega was then titrated on CPAP. Sleep efficiency was 95.5%, arousal index normal,  Willie Ortega achieved significant slow-wave sleep and REM sleep after CPAP was initiated. Average oxygen saturation improved at 91% with Willie nadir of 81%. Mild PLMS were noted without arousals. CPAP was titrated from 6-12 cm. His AHI was reduced to 6 per hour at the final pressure with supine REM sleep achieved during the study. Based on his test results and residual mild sleep disordered breathing noted on the final pressure, I prescribed CPAP at 13 cm via nasal pillows.    I reviewed his CPAP compliance data from 03/11/2015 through 04/09/2015 which is Willie total of 30 days during which time  Willie Ortega used his machine 28 days with percent used days greater than 4 hours of 73%, indicating adequate compliance, with an average usage of 4 hours and 58 minutes for all nights, residual AHI at 1.8 per hour, leak at times high with the 95th percentile at 38.4 L/m on Willie pressure of 13 cm.   The patient was diagnosed with obstructive sleep apnea several years ago. His last sleep study was about 12 years ago and Willie Ortega was advised to use CPAP at the time. Willie Ortega has been on CPAP therapy for about 12 years now. His machine is old. Willie Ortega has Willie tendency to take his mask off in the early morning hours and has not been very good about putting it back on. Willie Ortega does use it every night as far as putting the mask on. For the longest time Willie Ortega has been using Willie nasal mask but some 4 or 5 months ago Willie Ortega started using Willie nasal pillows mask which Willie Ortega prefers. Willie Ortega reports Willie pressure of 16 cm. As far as Willie Ortega can remember Willie Ortega was diagnosed with significant obstructive sleep apnea. I do not have test results from his previous sleep study available for review today. Willie Ortega reports an approximate 15 pound weight gain since his original sleep study. Willie Ortega would like to get an updated machine and updated settings. Willie Ortega does endorse daytime somnolence with an Epworth sleepiness score of 14 out of 24 today. Willie Ortega denies restless leg symptoms but does have occasional leg cramps at night. Willie Ortega may not be drinking enough water Willie Ortega endorses. Willie Ortega Drinks coffee occasionally maybe once or twice per week. Willie Ortega does not smoke. Willie Ortega drinks alcohol occasionally. Willie Ortega likes to nap if Willie Ortega can. Bedtime usually is 9:51 PM Willie Ortega falls asleep fairly quickly. Willie Ortega lives alone and sleeps alone typically. His wake time is 5 AM and Willie Ortega does not always wake up rested. Willie Ortega denies morning headaches or significant nocturia. Willie Ortega is not aware of any family history of RLS or obstructive sleep apnea. 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 Willie Ortega usually works 4 10-hour days. Willie Ortega gets his supplies from [employer] in [place].   Some 20 years ago Willie Ortega had nasal surgery. From what I can tell, Willie Ortega had septum deviation repair and inferior turbinate reduction. Willie Ortega does suffer from allergies and nasal congestion. Willie Ortega takes Claritin occasionally. Willie Ortega has used nasal saline rinses. Willie Ortega denies using Afrin.    His Past Medical History Is Significant For: Past Medical History:  Diagnosis Date  . Anxiety   . Back pain   . Sleep apnea     His Past Surgical History Is Significant For: Past Surgical History:  Procedure Laterality Date  . NOSE SURGERY      His Family History Is Significant For: Family History  Problem Relation Age of Onset  . Hypertension Mother   . Hyperlipidemia Mother   . Thyroid  cancer Sister     His Social History Is Significant For: Social History   Social History  . Marital status: Single    Spouse name: N/Willie  . Number of children: N/Willie  . Years of education: N/Willie   Social History Main Topics  . Smoking status: Never Smoker  . Smokeless tobacco: None  . Alcohol use 0.0 oz/week     Comment: 3-4 drinks Willie week   . Drug use: No  . Sexual activity: Not Asked   Other Topics Concern  . None   Social History Narrative   1-2  Caffeine drinks Willie week     His Allergies Are:  No Known Allergies:   His Current Medications Are:  Outpatient Encounter Prescriptions as of 04/29/2016  Medication Sig  . aspirin EC 81 MG tablet Take 81 mg by mouth daily.  . diazepam (VALIUM) 10 MG tablet Take 10 mg by mouth 3 (three) times daily as needed.   No facility-administered encounter medications on file as of 04/29/2016.   :  Review of Systems:  Out of Willie complete 14 point review of systems, all are reviewed and negative with the exception of these symptoms as listed below: Review of Systems  Neurological:       Patient states that Willie Ortega feels that his CPAP mask fits well and creates Willie good seal but gets an error message (leak too high)  on his CPAP machine in the morning.     Objective:   Neurologic Exam  Physical Exam Physical Examination:   Vitals:   04/29/16 1452  BP: 125/75  Pulse: 76  Resp: 20    General Examination: The patient is Willie very pleasant 60 y.o. Ortega in no acute distress. Willie Ortega appears well-developed and well-nourished and well groomed.   HEENT: Normocephalic, atraumatic, pupils are equal, round and reactive to light and accommodation. Extraocular tracking is good without limitation to gaze excursion or nystagmus noted. Normal smooth pursuit is noted. Hearing is grossly intact. Face is symmetric with normal facial animation and normal facial sensation. Speech is clear with no dysarthria noted. There is no hypophonia. There is no lip, neck/head, jaw or voice tremor. Neck is supple with full range of passive and active motion. There are no carotid bruits on auscultation. Oropharynx exam reveals: mild mouth dryness, good dental hygiene and moderate airway crowding, due to redundant soft palate and wider tongue. Mallampati is class II. Tongue protrudes centrally and palate elevates symmetrically. Tonsils are small. Willie Ortega has Willie Mild overbite. Nasal inspection reveals no significant nasal mucosal bogginess or redness and no septal deviation and no irritation noted at the nares.    Chest: Clear to auscultation without wheezing, rhonchi or crackles noted.  Heart: S1+S2+0, regular and normal without murmurs, rubs or gallops noted.   Abdomen: Soft, non-tender and non-distended with normal bowel sounds appreciated on auscultation.  Extremities: There is trace pitting edema in the distal lower extremities bilaterally, stable. Pedal pulses are intact.  Skin: Warm and dry without trophic changes noted. There are no varicose veins.  Musculoskeletal: exam reveals no obvious joint deformities, tenderness or joint swelling or erythema.   Neurologically:  Mental status: The patient is awake, alert and oriented in all 4 spheres. His immediate and remote memory, attention, language  skills and fund of knowledge are appropriate. There is no evidence of aphasia, agnosia, apraxia or anomia. Speech is clear with normal prosody and enunciation. Thought process is linear. Mood is normal and affect is normal.  Cranial nerves  II - XII are as described above under HEENT exam. In addition: shoulder shrug is normal with equal shoulder height noted. Motor exam: Normal bulk, strength and tone is noted. There is no drift, tremor or rebound. Romberg is negative. Reflexes are 2+ throughout. Fine motor skills and coordination: intact.  Cerebellar testing: No dysmetria or intention tremor on finger to nose testing. Heel to shin is unremarkable bilaterally. There is no truncal or gait ataxia.  Sensory exam: intact to light touch in the upper and lower extremities.  Gait, station and balance: Willie Ortega stands easily. No veering to one side is noted. No leaning to one side is noted. Posture is age-appropriate and stance is narrow based. Gait shows normal stride length and normal pace. No problems turning are noted. Willie Ortega turns en bloc. Tandem walk is unremarkable.   Assessment and Plan:  In summary, Willie Ortega is Willie very pleasant 60 year old Ortega with an underlying medical history of anxiety, low back pain and obesity, who presents for follow-up consultation of his severe obstructive sleep apnea. Willie Ortega had Willie split-night sleep study on 01/19/2015. Willie Ortega did well with CPAP therapy and has been better with his compliance. Willie Ortega continues to endorse good results. Willie Ortega has had more air leak. Willie Ortega would benefit from Willie mask refit and needs new supplies. Exam is stable at this time. Willie Ortega is advised to get in Touch with his DME company for Willie mask refit and to receive new supplies. We again reviewed his sleep study results briefly and went over his compliance data. Pressure is adequate at 13 cm at this time. Willie Ortega is advised to follow-up in one year, sooner as needed. I answered all his questions today and Willie Ortega was in agreement. I spent 25  minutes in total face-to-face time with the patient, more than 50% of which was spent in counseling and coordination of care, reviewing test results, reviewing medication and discussing or reviewing the diagnosis of OSA, its prognosis and treatment options.

## 2016-06-10 ENCOUNTER — Ambulatory Visit (HOSPITAL_COMMUNITY)
Admission: RE | Admit: 2016-06-10 | Discharge: 2016-06-10 | Disposition: A | Discharge status: Home / Self Care | Payer: 59 | Source: Ambulatory Visit | Attending: Registered Nurse | Admitting: Registered Nurse

## 2016-06-10 ENCOUNTER — Other Ambulatory Visit (HOSPITAL_COMMUNITY): Payer: Self-pay | Admitting: Registered Nurse

## 2016-06-10 DIAGNOSIS — M25561 Pain in right knee: Secondary | ICD-10-CM

## 2016-06-10 DIAGNOSIS — G8929 Other chronic pain: Secondary | ICD-10-CM

## 2016-06-10 DIAGNOSIS — M1711 Unilateral primary osteoarthritis, right knee: Secondary | ICD-10-CM | POA: Diagnosis not present

## 2016-10-16 ENCOUNTER — Encounter: Payer: Self-pay | Admitting: Orthopaedic Surgery

## 2016-10-16 ENCOUNTER — Ambulatory Visit (INDEPENDENT_AMBULATORY_CARE_PROVIDER_SITE_OTHER): Payer: 59 | Admitting: Orthopaedic Surgery

## 2016-10-16 VITALS — BP 136/92 | HR 76 | Temp 97.7°F | Ht 75.5 in | Wt 272.0 lb

## 2016-10-16 DIAGNOSIS — G8929 Other chronic pain: Secondary | ICD-10-CM

## 2016-10-16 DIAGNOSIS — M25561 Pain in right knee: Secondary | ICD-10-CM | POA: Diagnosis not present

## 2016-10-16 NOTE — Progress Notes (Signed)
Subjective:    Patient ID: Willie Ortega, male    DOB: [birth year], 61 y.o.   MRN: [id number]  HPI He has had pain in the right knee since September, 2017.  He was seen at [facility] in October for this as he was no better.  He had x-rays done 06-10-16 which showed mild degenerative changes.  He was placed on Mobic 7.5  He had good response and stopped the Mobic after a month or so.  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  His knee started hurting again right after the New Year.  He has swelling, popping and no giving way but it feels it might want to give way.  He has resumed his Mobic about ten days ago and it has helped some. He has a brace he uses at times.  He has no direct trauma, no redness, no paresthesias.   Review of Systems  HENT: Negative for congestion.   Respiratory: Negative for cough and shortness of breath.   Cardiovascular: Negative for chest pain and leg swelling.  Endocrine: Negative for cold intolerance.  Musculoskeletal: Positive for arthralgias, back pain, gait problem and joint swelling.  Allergic/Immunologic: Negative for environmental allergies.   Past Medical History:  Diagnosis Date  . Anxiety   . Back pain   . Sleep apnea     Past Surgical History:  Procedure Laterality Date  . NOSE SURGERY      Current Outpatient Prescriptions on File Prior to Visit  Medication Sig Dispense Refill  . aspirin EC 81 MG tablet Take 81 mg by mouth daily.    . diazepam (VALIUM) 10 MG tablet Take 10 mg by mouth 3 (three) times daily as needed.  0   No current facility-administered medications on file prior to visit.     Social History   Social History  . Marital status: Single    Spouse name: N/A  . Number of children: N/A  . Years of education: N/A   Occupational History  . Not on file.   Social History Main Topics  . Smoking status: Never Smoker  . Smokeless tobacco: Never Used  . Alcohol use No     Comment: 3-4 drinks a week   .  Drug use: Unknown  . Sexual activity: Not on file   Other Topics Concern  . Not on file   Social History Narrative   1-2  Caffeine drinks a week     Family History  Problem Relation Age of Onset  . Hypertension Mother   . Hyperlipidemia Mother   . Heart attack Mother   . Thyroid cancer Sister     BP (!) 136/92   Pulse 76   Temp 97.7 F (36.5 C)   Ht 6' 3.5" (1.918 m)   Wt 272 lb (123.4 kg)   BMI 33.55 kg/m      Objective:   Physical Exam  Constitutional: He is oriented to person, place, and time. He appears well-developed and well-nourished.  HENT:  Head: Normocephalic and atraumatic.  Eyes: Conjunctivae and EOM are normal. Pupils are equal, round, and reactive to light.  Neck: Normal range of motion. Neck supple.  Cardiovascular: Normal rate, regular rhythm and intact distal pulses.   Pulmonary/Chest: Effort normal.  Abdominal: Soft.  Musculoskeletal: He exhibits tenderness (Right knee tender, more medially and under medial patella, no effusion, ROM full, no limp, knee stable.  Nv intact.  Left knee negative.).  Neurological: He is alert and oriented to  person, place, and time. He has normal reflexes. He displays normal reflexes. No cranial nerve deficit. He exhibits normal muscle tone. Coordination normal.  Skin: Skin is warm and dry.  Psychiatric: He has a normal mood and affect. His behavior is normal. Judgment and thought content normal.  Vitals reviewed.         Assessment & Plan:   Encounter Diagnosis  Name Primary?  . Chronic pain of right knee Yes   PROCEDURE NOTE:  The patient requests injections of the right knee , verbal consent was obtained.  The right knee was prepped appropriately after time out was performed.   Sterile technique was observed and injection of 1 cc of Depo-Medrol 40 mg with several cc's of plain xylocaine. Anesthesia was provided by ethyl chloride and a 20-gauge needle was used to inject the knee area. The injection was  tolerated well.  A band aid dressing was applied.  The patient was advised to apply ice later today and tomorrow to the injection sight as needed.  I have told him to continue the Mobic 7.5 bid pc   Return in two weeks.  Call if any problem.  Consider MRI if not improved.  Precautions discussed.  Electronically Signed Darreld McleanWayne Wilder Amodei, MD 2/7/20188:45 AM

## 2016-10-16 NOTE — Patient Instructions (Signed)

## 2016-10-31 ENCOUNTER — Encounter: Payer: Self-pay | Admitting: Orthopaedic Surgery

## 2016-10-31 ENCOUNTER — Ambulatory Visit (INDEPENDENT_AMBULATORY_CARE_PROVIDER_SITE_OTHER): Payer: 59 | Admitting: Orthopaedic Surgery

## 2016-10-31 VITALS — BP 134/81 | HR 73 | Temp 97.9°F | Ht 75.5 in | Wt 270.0 lb

## 2016-10-31 DIAGNOSIS — G8929 Other chronic pain: Secondary | ICD-10-CM

## 2016-10-31 DIAGNOSIS — M25561 Pain in right knee: Secondary | ICD-10-CM | POA: Diagnosis not present

## 2016-10-31 NOTE — Progress Notes (Signed)
Patient Willie Ortega:[id number]:Willie Ortega, male DOB:16-Mar-1956, 61 y.o. JYN:[id number]  Chief Complaint  Patient presents with  . Follow-up    right knee pain    HPI  Quinn Axeommy D Scaife is a 61 y.o. male who has pain of the right knee.  He is improved.  He has much less pain and no swelling.  He has no new trauma.  He has no giving way or redness.  He is taking his Mobic. HPI  Body mass index is 33.3 kg/m.  ROS  Review of Systems  HENT: Negative for congestion.   Respiratory: Negative for cough and shortness of breath.   Cardiovascular: Negative for chest pain and leg swelling.  Endocrine: Negative for cold intolerance.  Musculoskeletal: Positive for arthralgias, back pain, gait problem and joint swelling.  Allergic/Immunologic: Negative for environmental allergies.    Past Medical History:  Diagnosis Date  . Anxiety   . Back pain   . Sleep apnea     Past Surgical History:  Procedure Laterality Date  . NOSE SURGERY      Family History  Problem Relation Age of Onset  . Hypertension Mother   . Hyperlipidemia Mother   . Heart attack Mother   . Thyroid cancer Sister     Social History Social History  Substance Use Topics  . Smoking status: Never Smoker  . Smokeless tobacco: Never Used  . Alcohol use No     Comment: 3-4 drinks a week     No Known Allergies  Current Outpatient Prescriptions  Medication Sig Dispense Refill  . aspirin EC 81 MG tablet Take 81 mg by mouth daily.    . diazepam (VALIUM) 10 MG tablet Take 10 mg by mouth 3 (three) times daily as needed.  0   No current facility-administered medications for this visit.      Physical Exam  Blood pressure 134/81, pulse 73, temperature 97.9 F (36.6 C), height 6' 3.5" (1.918 m), weight 270 lb (122.5 kg).  Constitutional: overall normal hygiene, normal nutrition, well developed, normal grooming, normal body habitus. Assistive device:none  Musculoskeletal: gait and station Limp none, muscle tone and strength are  normal, no tremors or atrophy is present.  .  Neurological: coordination overall normal.  Deep tendon reflex/nerve stretch intact.  Sensation normal.  Cranial nerves II-XII intact.   Skin:   Normal overall no scars, lesions, ulcers or rashes. No psoriasis.  Psychiatric: Alert and oriented x 3.  Recent memory intact, remote memory unclear.  Normal mood and affect. Well groomed.  Good eye contact.  Cardiovascular: overall no swelling, no varicosities, no edema bilaterally, normal temperatures of the legs and arms, no clubbing, cyanosis and good capillary refill.  Lymphatic: palpation is normal.  The right lower extremity is examined:  Inspection:  Thigh:  Non-tender and no defects  Knee does not have swelling 0 effusion.                        Joint tenderness is not present                        Patient is not tender over the medial joint line  Lower Leg:  Has normal appearance and no tenderness or defects  Ankle:  Non-tender and no defects  Foot:  Non-tender and no defects Range of Motion:  Knee:  Range of motion is: 0-115  Crepitus is  present  Ankle:  Range of motion is normal. Strength and Tone:  The right lower extremity has normal strength and tone. Stability:  Knee:  The knee is stable.  Ankle:  The ankle is stable.  The patient has been educated about the nature of the problem(s) and counseled on treatment options.  The patient appeared to understand what I have discussed and is in agreement with it.  Encounter Diagnosis  Name Primary?  . Chronic pain of right knee Yes    PLAN Call if any problems.  Precautions discussed.  Continue current medications.   Return to clinic 1 month   Call and cancel if doing well next month.  Electronically Signed Darreld Mclean, MD 2/22/20182:47 PM

## 2016-11-28 ENCOUNTER — Ambulatory Visit: Payer: 59 | Admitting: Orthopaedic Surgery

## 2016-12-01 DIAGNOSIS — R509 Fever, unspecified: Secondary | ICD-10-CM | POA: Diagnosis not present

## 2016-12-01 DIAGNOSIS — B309 Viral conjunctivitis, unspecified: Secondary | ICD-10-CM | POA: Diagnosis not present

## 2016-12-01 DIAGNOSIS — J0110 Acute frontal sinusitis, unspecified: Secondary | ICD-10-CM | POA: Diagnosis not present

## 2016-12-02 DIAGNOSIS — H578 Other specified disorders of eye and adnexa: Secondary | ICD-10-CM | POA: Diagnosis not present

## 2016-12-20 DIAGNOSIS — J069 Acute upper respiratory infection, unspecified: Secondary | ICD-10-CM | POA: Diagnosis not present

## 2016-12-20 DIAGNOSIS — J329 Chronic sinusitis, unspecified: Secondary | ICD-10-CM | POA: Diagnosis not present

## 2016-12-20 DIAGNOSIS — Z1389 Encounter for screening for other disorder: Secondary | ICD-10-CM | POA: Diagnosis not present

## 2016-12-20 DIAGNOSIS — R070 Pain in throat: Secondary | ICD-10-CM | POA: Diagnosis not present

## 2016-12-20 DIAGNOSIS — J343 Hypertrophy of nasal turbinates: Secondary | ICD-10-CM | POA: Diagnosis not present

## 2017-01-22 IMAGING — DX DG KNEE COMPLETE 4+V*R*
4 series · 4 of 4 positions shown · non-contrast
Comparison: None.

CLINICAL DATA: Right knee pain for 2 months.  No reported injury .

EXAM:
RIGHT KNEE - COMPLETE 4+ VIEW

[knee ap]
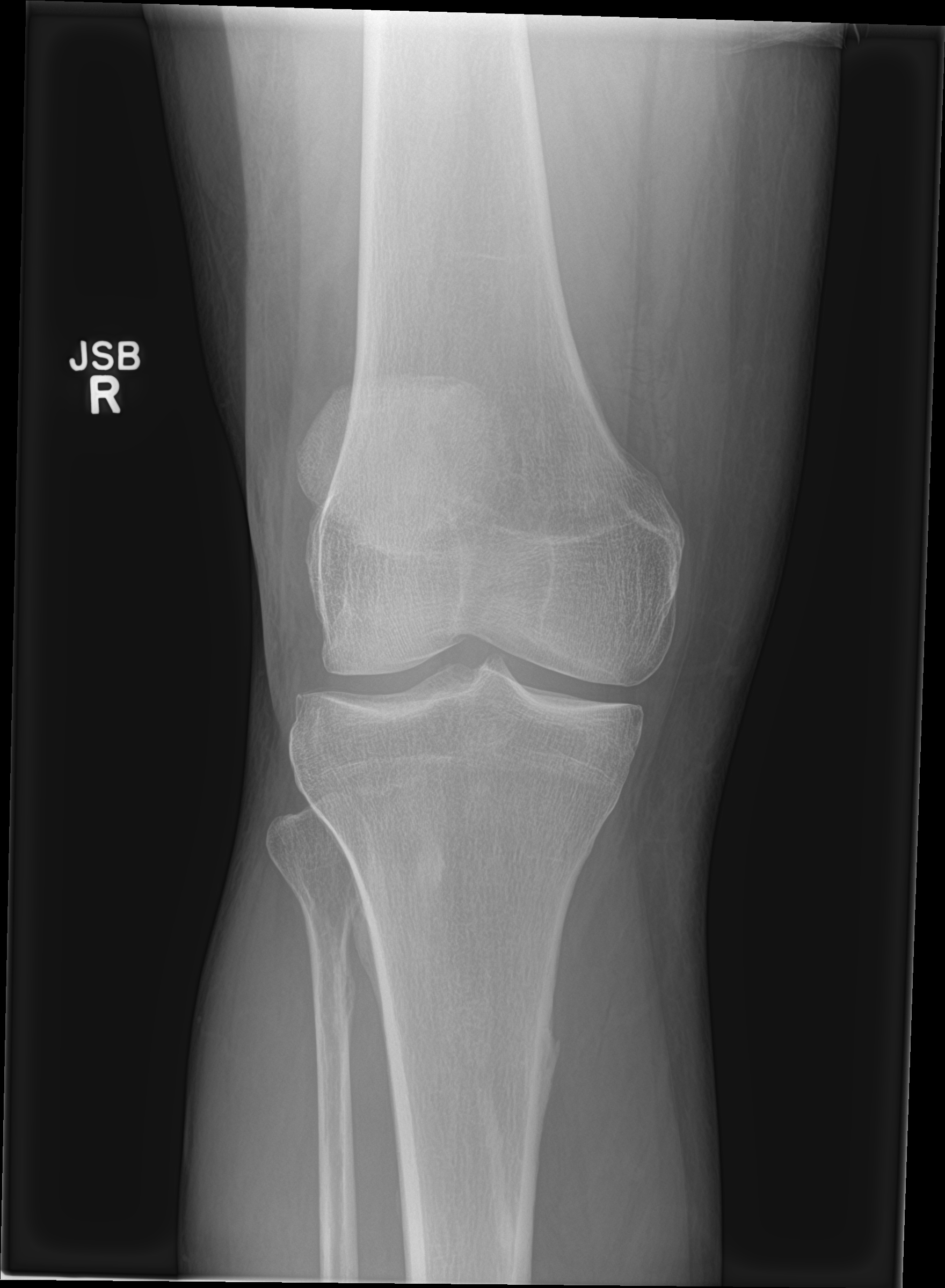

[tunnel]
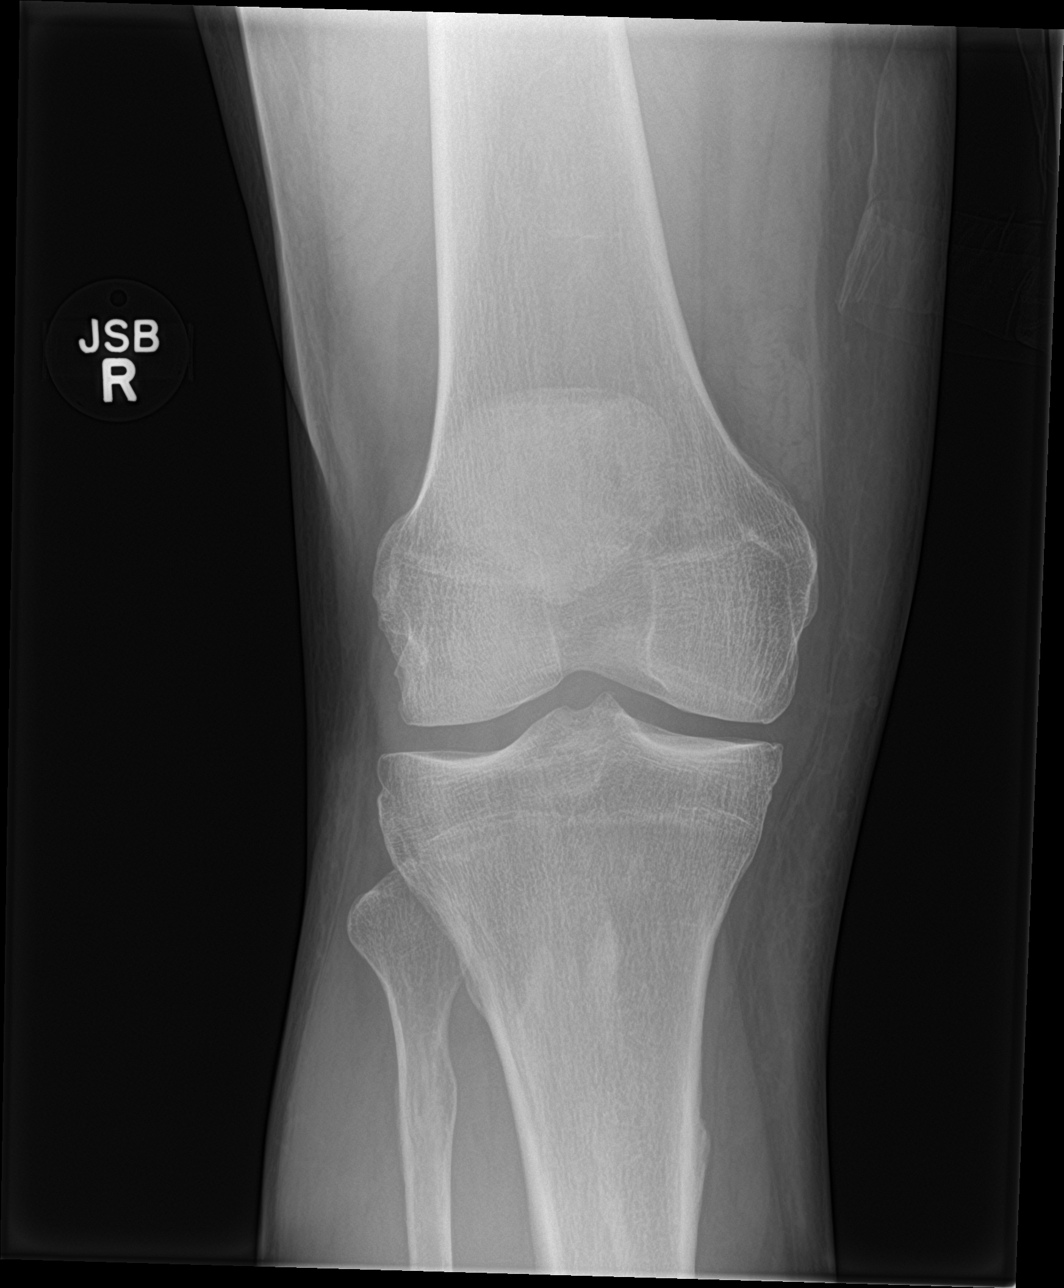

[knee lat]
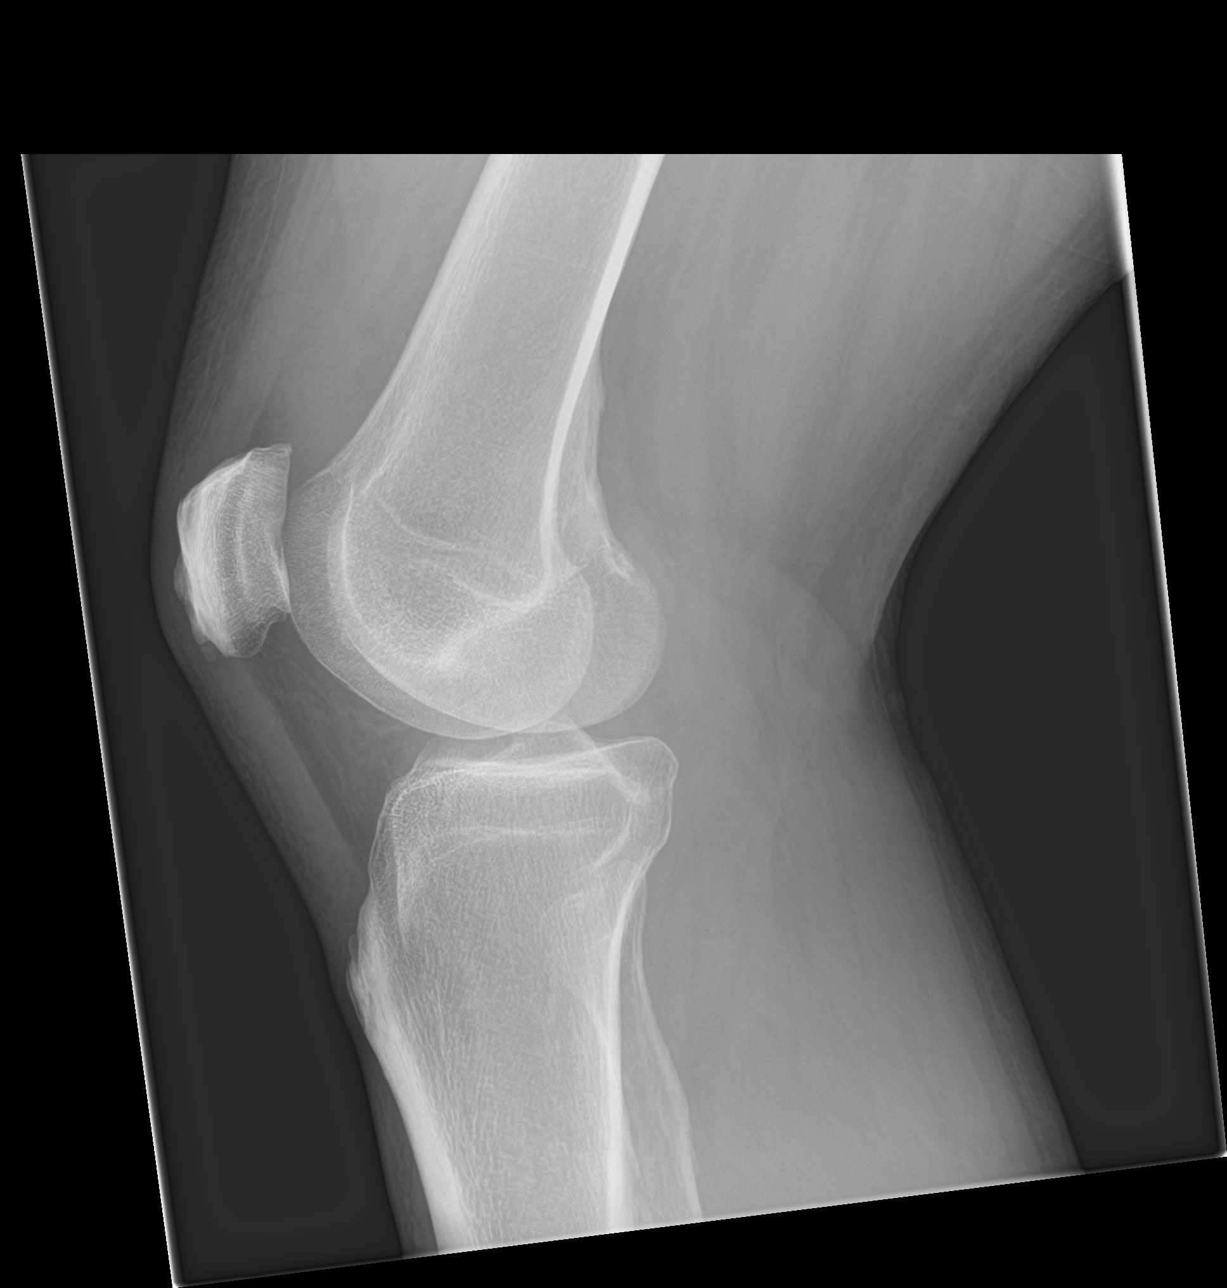

[knee sunrise]
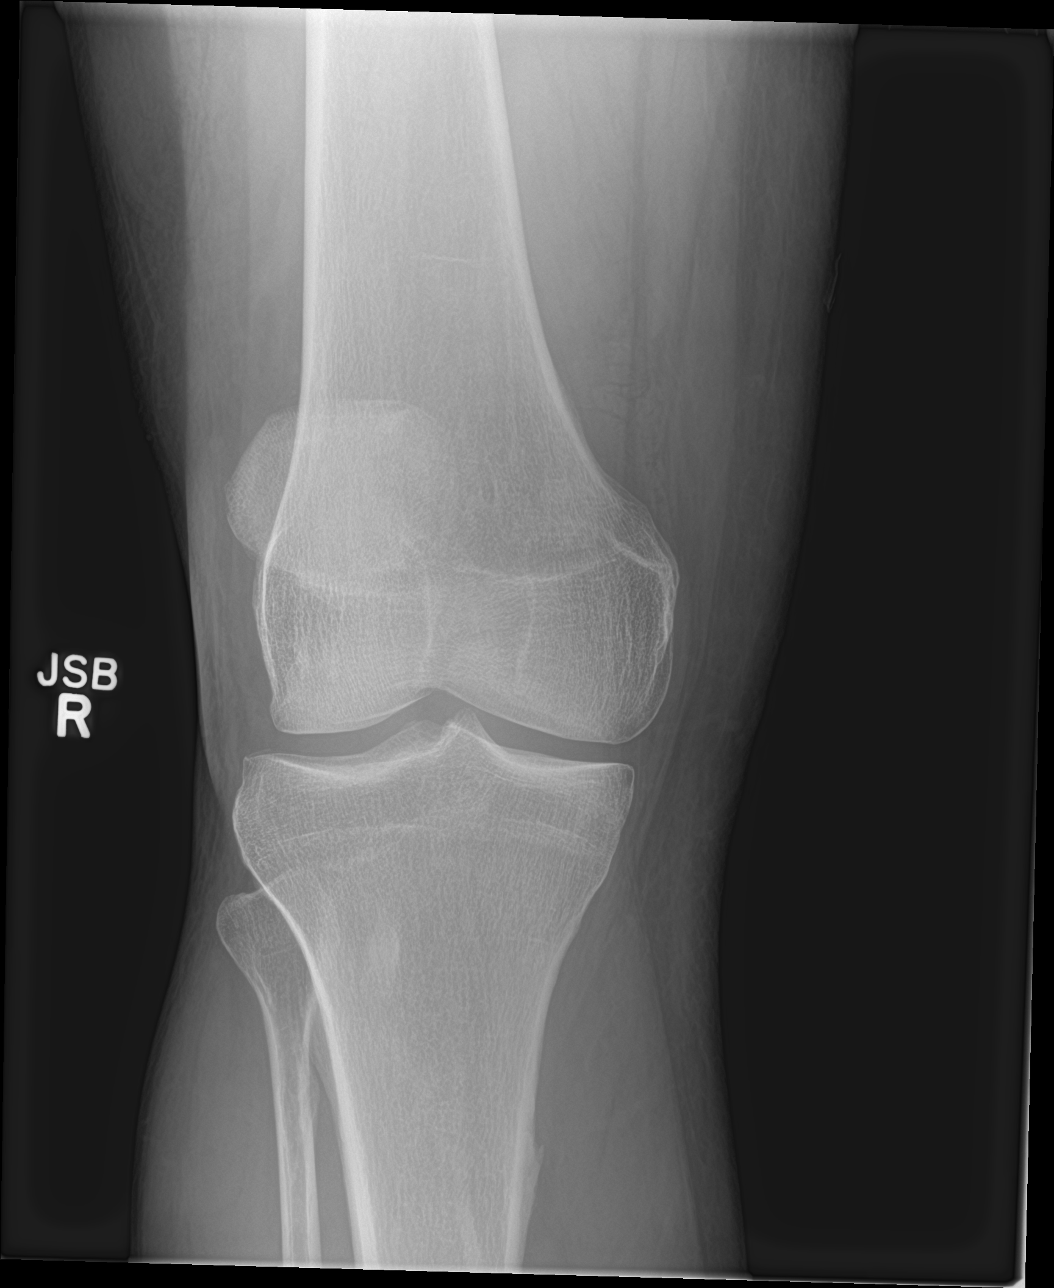

[4 of 4 positions shown; findings below may reference images not displayed]

FINDINGS: No evidence of fracture or dislocation. No focal bony abnormality
identified. Mild degenerative change. Small knee joint effusion
cannot be excluded .
IMPRESSION: No acute bony abnormality identified. Mild degenerative change.
Small knee joint effusion cannot be excluded.

## 2017-02-17 DIAGNOSIS — H40033 Anatomical narrow angle, bilateral: Secondary | ICD-10-CM | POA: Diagnosis not present

## 2017-02-17 DIAGNOSIS — H1013 Acute atopic conjunctivitis, bilateral: Secondary | ICD-10-CM | POA: Diagnosis not present

## 2017-03-07 DIAGNOSIS — B354 Tinea corporis: Secondary | ICD-10-CM | POA: Diagnosis not present

## 2017-03-07 DIAGNOSIS — E782 Mixed hyperlipidemia: Secondary | ICD-10-CM | POA: Diagnosis not present

## 2017-03-07 DIAGNOSIS — R21 Rash and other nonspecific skin eruption: Secondary | ICD-10-CM | POA: Diagnosis not present

## 2017-04-04 DIAGNOSIS — Z0001 Encounter for general adult medical examination with abnormal findings: Secondary | ICD-10-CM | POA: Diagnosis not present

## 2017-04-04 DIAGNOSIS — R7309 Other abnormal glucose: Secondary | ICD-10-CM | POA: Diagnosis not present

## 2017-04-04 DIAGNOSIS — Z1389 Encounter for screening for other disorder: Secondary | ICD-10-CM | POA: Diagnosis not present

## 2017-04-04 DIAGNOSIS — Z1211 Encounter for screening for malignant neoplasm of colon: Secondary | ICD-10-CM | POA: Diagnosis not present

## 2017-04-04 DIAGNOSIS — B354 Tinea corporis: Secondary | ICD-10-CM | POA: Diagnosis not present

## 2017-04-04 DIAGNOSIS — R21 Rash and other nonspecific skin eruption: Secondary | ICD-10-CM | POA: Diagnosis not present

## 2017-04-11 DIAGNOSIS — R55 Syncope and collapse: Secondary | ICD-10-CM | POA: Diagnosis not present

## 2017-04-11 DIAGNOSIS — R42 Dizziness and giddiness: Secondary | ICD-10-CM | POA: Diagnosis not present

## 2017-04-28 ENCOUNTER — Ambulatory Visit: Payer: BLUE CROSS/BLUE SHIELD | Admitting: Neurology

## 2017-05-09 DIAGNOSIS — L308 Other specified dermatitis: Secondary | ICD-10-CM | POA: Diagnosis not present

## 2017-05-09 DIAGNOSIS — L258 Unspecified contact dermatitis due to other agents: Secondary | ICD-10-CM | POA: Diagnosis not present

## 2017-05-09 DIAGNOSIS — L570 Actinic keratosis: Secondary | ICD-10-CM | POA: Diagnosis not present

## 2017-06-26 DIAGNOSIS — M5412 Radiculopathy, cervical region: Secondary | ICD-10-CM | POA: Diagnosis not present

## 2017-07-03 ENCOUNTER — Ambulatory Visit: Payer: BLUE CROSS/BLUE SHIELD | Admitting: Neurology

## 2017-08-12 ENCOUNTER — Encounter: Payer: Self-pay | Admitting: Neurology

## 2017-08-14 ENCOUNTER — Encounter: Payer: Self-pay | Admitting: Neurology

## 2017-08-14 ENCOUNTER — Ambulatory Visit: Payer: 59 | Admitting: Neurology

## 2017-08-14 VITALS — BP 132/86 | HR 63 | Ht 76.0 in | Wt 285.0 lb

## 2017-08-14 DIAGNOSIS — G4733 Obstructive sleep apnea (adult) (pediatric): Secondary | ICD-10-CM | POA: Diagnosis not present

## 2017-08-14 DIAGNOSIS — Z9989 Dependence on other enabling machines and devices: Secondary | ICD-10-CM | POA: Diagnosis not present

## 2017-08-14 NOTE — Patient Instructions (Addendum)
Keep up the good work! We will see you back in one year.   You can try Melatonin at night for sleep: take 1 mg to 3 mg, one to 2 hours before your bedtime. You can go up to 5 mg if needed. It is over the counter and comes in pill form, chewable form and spray, if you prefer.    You may need a different nasal pillows type or size to help bring down your leak.   Please work on weight loss.

## 2017-08-14 NOTE — Progress Notes (Signed)
Subjective:    Patient ID: Willie Ortega is a 61 y.o. male.  HPI     Interim history:   Willie Ortega is a 61 year old right-handed gentleman with an underlying medical history of anxiety, low back pain and obesity, who presents for follow-up consultation of his severe obstructive sleep apnea, on treatment with CPAP. The patient is unaccompanied today. I last saw him on 04/29/2017, at which time he reported doing well, he was compliant with treatment. He was advised to follow-up routinely in one year.   Today, 08/14/2017: I reviewed his CPAP compliance data from 07/14/2017 through 08/12/2017 which is a total of 30 days, during which time he used his CPAP 28 days with percent used days greater than 4 hours at 90%, indicating excellent compliance with an average usage of 5 hours and 55 minutes, residual AHI 2 per hour, leak on the high side with the 95th percentile at 35.1 L/m on a pressure of 13 cm. He reports doing well with his CPAP, needs new supplies, through [facility]. He has not had any supplies in about 6 months he admits. He has not changed the filter in the past several months. He has gained weight. He does not always hydrate well enough throughout the day he admits. He has some difficulty maintaining sleep.  The patient's allergies, current medications, family history, past medical history, past social history, past surgical history and problem list were reviewed and updated as appropriate.   Previously (copied from previous notes for reference):   I saw him on 10/19/2015, at which time he reported that he was not fully using his CPAP machine recently because he had a stomach virus a couple of months ago when he had bronchitis a few weeks ago. Also he was out of town some and did not take his machine with him. He typically does not use the humidifier. He still has a tendency to pull the mask off in the middle of the night and not realize it. He does not like the ramp time. He had gained  some weight. We talked about the importance of compliance with CPAP therapy.   I reviewed his compliance data from 03/24/2016 through 04/22/2016 which is a total of 30 days during which time he used his machine 28 days with percent used days greater than 4 hours at 73%, indicating adequate compliance with an average usage for all days of 4 hours and 55 minutes, residual AHI 1.9 per hour, leak at times high with the 95th percentile at 45.5 L/m on a pressure of 13 cm.   I saw him on 04/10/2015, at which time he reported doing fairly well. He had adjusted reasonably well to CPAP therapy. He was adequately compliant with treatment. He had gained a few pounds. He did indicate sleeping better and having less daytime somnolence with CPAP therapy.   I reviewed his CPAP compliance data from 09/18/2015 through 10/17/2015 which is a total of 30 days during which time he used his machine 21 days with percent used days greater than 4 hours at home today for 47%, indicating suboptimal compliance with an average usage of 3 hours and 12 minutes, residual AHI at 1.3 per hour, leak borderline for the 95th percentile at 25.5 L/m on a pressure of 13 cm.    I first met him on 01/06/2015 at the request of his primary care provider, at which time the patient reported a previous diagnosis of obstructive sleep apnea. His last study was over 12 years ago  when he had an old CPAP machine and needed reevaluation. I invited him back for a sleep study. He had a split-night sleep study on 01/19/2015 underwent over his test results with him in detail today. Sleep efficiency at baseline was 84.7% with a latency to sleep of 2.5 minutes and wake after sleep onset of 18.5 minutes with mild to moderate sleep fragmentation noted. He had a markedly elevated arousal index secondary to respiratory events. He had absence of slow-wave sleep and REM sleep prior to CPAP initiation. He had no significant PLMS, EKG or EEG changes. He had mild to moderate  snoring. Total AHI was elevated at 71.1 per hour, baseline oxygen saturation 89% only, nadir of 74%. He was then titrated on CPAP. Sleep efficiency was 95.5%, arousal index normal, he achieved significant slow-wave sleep and REM sleep after CPAP was initiated. Average oxygen saturation improved at 91% with a nadir of 81%. Mild PLMS were noted without arousals. CPAP was titrated from 6-12 cm. His AHI was reduced to 6 per hour at the final pressure with supine REM sleep achieved during the study. Based on his test results and residual mild sleep disordered breathing noted on the final pressure, I prescribed CPAP at 13 cm via nasal pillows.    I reviewed his CPAP compliance data from 03/11/2015 through 04/09/2015 which is a total of 30 days during which time he used his machine 28 days with percent used days greater than 4 hours of 73%, indicating adequate compliance, with an average usage of 4 hours and 58 minutes for all nights, residual AHI at 1.8 per hour, leak at times high with the 95th percentile at 38.4 L/m on a pressure of 13 cm.   The patient was diagnosed with obstructive sleep apnea several years ago. His last sleep study was about 12 years ago and he was advised to use CPAP at the time. He has been on CPAP therapy for about 12 years now. His machine is old. He has a tendency to take his mask off in the early morning hours and has not been very good about putting it back on. He does use it every night as far as putting the mask on. For the longest time he has been using a nasal mask but some 4 or 5 months ago he started using a nasal pillows mask which he prefers. He reports a pressure of 16 cm. As far as he can remember he was diagnosed with significant obstructive sleep apnea. I do not have test results from his previous sleep study available for review today. He reports an approximate 15 pound weight gain since his original sleep study. He would like to get an updated machine and updated settings. He  does endorse daytime somnolence with an Epworth sleepiness score of 14 out of 24 today. He denies restless leg symptoms but does have occasional leg cramps at night. He may not be drinking enough water he endorses. He Drinks coffee occasionally maybe once or twice per week. He does not smoke. He drinks alcohol occasionally. He likes to nap if he can. Bedtime usually is 9:51 PM he falls asleep fairly quickly. He lives alone and sleeps alone typically. His wake time is 5 AM and he does not always wake up rested. He denies morning headaches or significant nocturia. He is not aware of any family history of RLS or obstructive sleep apnea. 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 He gets his supplies from  [facility] in [place].   Some 20 years ago he had nasal surgery. From what I can tell, he had septum deviation repair and inferior turbinate reduction. He does suffer from allergies and nasal congestion. He takes Claritin occasionally. He has used nasal saline rinses. He denies using Afrin.   His Past Medical History Is Significant For: Past Medical History:  Diagnosis Date  . Anxiety   . Back pain   . Sleep apnea    His Past Surgical History Is Significant For:  His Family History Is Significant For: Family History  Problem Relation Age of Onset  . Hypertension Mother   . Hyperlipidemia Mother   . Heart attack Mother   . Thyroid cancer Sister     His Social History Is Significant For: Social History   Socioeconomic History  . Marital status: Single    Spouse name: None  . Number of children: None  . Years of education: None  . Highest education level: None  Social Needs  . Financial resource strain: None  . Food insecurity - worry: None  . Food insecurity - inability: None  . Transportation needs - medical: None  . Transportation needs - non-medical: None  Occupational History  . None  Tobacco Use  . Smoking status: Never Smoker  .  Smokeless tobacco: Never Used  Substance and Sexual Activity  . Alcohol use: No    Alcohol/week: 0.0 oz    Comment: 3-4 drinks a week   . Drug use: None  . Sexual activity: None  Other Topics Concern  . None  Social History Narrative   1-2  Caffeine drinks a week     His Allergies Are:  No Known Allergies:   His Current Medications Are:  Outpatient Encounter Medications as of 08/14/2017  Medication Sig  . aspirin EC 81 MG tablet Take 81 mg by mouth daily.  . meloxicam (MOBIC) 7.5 MG tablet Take 1 tablet by mouth 2 (two) times daily as needed.  . [DISCONTINUED] diazepam (VALIUM) 10 MG tablet Take 10 mg by mouth 3 (three) times daily as needed.   No facility-administered encounter medications on file as of 08/14/2017.   :  Review of Systems:  Out of a complete 14 point review of systems, all are reviewed and negative with the exception of these symptoms as listed below: Review of Systems  Neurological:       Pt presents today to discuss his cpap. Pt is using [facility] and does need an RX for new supplies.    Objective:  Neurological Exam  Physical Exam Physical Examination:   Vitals:   08/14/17 1302  BP: 132/86  Pulse: 63   General Examination: The patient is a very pleasant 61 y.o. male in no acute distress. He appears well-developed and well-nourished and well groomed.   HEENT: Normocephalic, atraumatic, pupils are equal, round and reactive to light and accommodation. Extraocular tracking is good without limitation to gaze excursion or nystagmus noted. Normal smooth pursuit is noted. Hearing is grossly intact. Face is symmetric with normal facial animation and normal facial sensation. Speech is clear with no dysarthria noted. There is no hypophonia. There is no lip, neck/head, jaw or voice tremor. Neck is supple with full range of passive and active motion. There are no carotid bruits on auscultation. Oropharynx exam reveals: mild mouth dryness, good dental  hygiene and moderate airway crowding. Mallampati is class II. Tongue protrudes centrally and palate elevates symmetrically.     Chest: Clear to  auscultation without wheezing, rhonchi or crackles noted.  Heart: S1+S2+0, regular and normal without murmurs, rubs or gallops noted.   Abdomen: Soft, non-tender and non-distended with normal bowel sounds appreciated on auscultation.  Extremities: There is trace pitting edema in the distal lower extremities bilaterally, stable. Pedal pulses are intact.  Skin: Warm and dry without trophic changes noted. There are no varicose veins.  Musculoskeletal: exam reveals no obvious joint deformities, tenderness or joint swelling or erythema.   Neurologically:  Mental status: The patient is awake, alert and oriented in all 4 spheres. His immediate and remote memory, attention, language skills and fund of knowledge are appropriate. There is no evidence of aphasia, agnosia, apraxia or anomia. Speech is clear with normal prosody and enunciation. Thought process is linear. Mood is normal and affect is normal.  Cranial nerves II - XII are as described above under HEENT exam. In addition: shoulder shrug is normal with equal shoulder height noted. Motor exam: Normal bulk, strength and tone is noted. There is no drift, tremor or rebound. Reflexes are 1+ throughout. Fine motor skills and coordination: intact.  Cerebellar testing: No dysmetria or intention tremor on finger to nose testing. Heel to shin is unremarkable bilaterally. There is no truncal or gait ataxia.  Sensory exam: intact to light touch in the upper and lower extremities.  Gait, station and balance: He stands easily. No veering to one side is noted. No leaning to one side is noted. Posture is age-appropriate and stance is narrow based. Gait shows normal stride length and normal pace. No problems turning are noted.    Assessment and Plan:  In summary, Deron Poole Raper is a very pleasant 61 year old  male with an underlying medical history of anxiety, low back pain and obesity, who presents for follow-up consultation of his severe obstructive sleep apnea. He had a split-night sleep study on 01/19/2015 and did well with CPAP therapy. His compliance is excellent and he has done well. He is commended for his treatment adherence. He continues to endorse good results. He has had higher leak consistently. I will order supplies through is DME company; he may need a mask refit. We briefly reviewed his sleep study results again today and reviewed his compliance data. Pressure is adequate at 13 cm at this time. He could try melatonin at night for sleep, he is encouraged to try to strive for weight loss and drink more water. He is advised to follow-up in one year, sooner as needed. I spent 15 minutes in total face-to-face time with the patient, more than 50% of which was spent in counseling and coordination of care, reviewing test results, reviewing medication and discussing or reviewing the diagnosis of OSA, its prognosis and treatment options. Pertinent laboratory and imaging test results that were available during this visit with the patient were reviewed by me and considered in my medical decision making (see chart for details).

## 2017-08-26 DIAGNOSIS — G4733 Obstructive sleep apnea (adult) (pediatric): Secondary | ICD-10-CM | POA: Diagnosis not present

## 2017-08-29 DIAGNOSIS — G4733 Obstructive sleep apnea (adult) (pediatric): Secondary | ICD-10-CM | POA: Diagnosis not present

## 2017-12-05 DIAGNOSIS — L821 Other seborrheic keratosis: Secondary | ICD-10-CM | POA: Diagnosis not present

## 2017-12-05 DIAGNOSIS — K130 Diseases of lips: Secondary | ICD-10-CM | POA: Diagnosis not present

## 2018-03-06 DIAGNOSIS — H402231 Chronic angle-closure glaucoma, bilateral, mild stage: Secondary | ICD-10-CM | POA: Diagnosis not present

## 2018-04-22 DIAGNOSIS — Z01818 Encounter for other preprocedural examination: Secondary | ICD-10-CM | POA: Diagnosis not present

## 2018-04-22 DIAGNOSIS — H2511 Age-related nuclear cataract, right eye: Secondary | ICD-10-CM | POA: Diagnosis not present

## 2018-04-22 DIAGNOSIS — H25811 Combined forms of age-related cataract, right eye: Secondary | ICD-10-CM | POA: Diagnosis not present

## 2018-07-24 DIAGNOSIS — Z719 Counseling, unspecified: Secondary | ICD-10-CM | POA: Diagnosis not present

## 2018-08-20 ENCOUNTER — Ambulatory Visit: Payer: 59 | Admitting: Neurology

## 2018-09-29 ENCOUNTER — Encounter: Payer: Self-pay | Admitting: Adult Health

## 2018-10-01 ENCOUNTER — Encounter: Payer: Self-pay | Admitting: Neurology

## 2018-10-01 ENCOUNTER — Ambulatory Visit: Payer: 59 | Admitting: Neurology

## 2018-10-01 VITALS — BP 131/88 | HR 67 | Ht 76.0 in | Wt 275.0 lb

## 2018-10-01 DIAGNOSIS — Z9989 Dependence on other enabling machines and devices: Secondary | ICD-10-CM

## 2018-10-01 DIAGNOSIS — G4733 Obstructive sleep apnea (adult) (pediatric): Secondary | ICD-10-CM | POA: Diagnosis not present

## 2018-10-01 NOTE — Progress Notes (Signed)
Subjective:    Patient ID: Willie Ortega is a 63 y.o. male.  HPI     Interim history:   Willie Ortega is a 63 year old right-handed gentleman with an underlying medical history of anxiety, low back pain and obesity, who presents for follow-up consultation of his OSA, well established on CPAP therapy. The patient is unaccompanied today and presents for his yearly checkup. I last saw him on 08/14/2017, at which time he was compliant with his CPAP, doing well. He did have some weight gain. He reported some difficulty maintaining sleep. He was advised to try melatonin at night for sleep. He was encouraged to pursue weight loss. He was also advised to pursue a different mask or different size with his DME company to see if the lead would come down.  Today, 10/01/2018: I reviewed his CPAP compliance data from 08/31/2018 through 09/29/2018 which is a total of 30 days, during which time he used his machine every night with percent used days greater than 4 hours at 90%, indicating excellent compliance with an average usage of 5 hours and 29 minutes, residual AHI 3.5 per hour, leak high with the 95th percentile at 69.7 L/m on a pressure of 13 cm. He reports not having had new supplies and perhaps a year. He does not believe he got a new humidifier chamber or house since the original ones. He still uses nasal pillows but had the same nasal pillows for the past year he recalls. He does not have good coverage for his insurance for supplies as he reports. His DME company is [employer] in [place]. He is in the process of switching over his medical care to the [facility]. He had right cataract surgery in August 2019.   The patient's allergies, current medications, family history, past medical history, past social history, past surgical history and problem list were reviewed and updated as appropriate.    Previously (copied from previous notes for reference):   I reviewed his CPAP compliance data from 07/14/2017  through 08/12/2017 which is a total of 30 days, during which time he used his CPAP 28 days with percent used days greater than 4 hours at 90%, indicating excellent compliance with an average usage of 5 hours and 55 minutes, residual AHI 2 per hour, leak on the high side with the 95th percentile at 35.1 L/m on a pressure of 13 cm.  I saw him on 04/29/2017, at which time he reported doing well, he was compliant with treatment. He was advised to follow-up routinely in one year.      I saw him on 10/19/2015, at which time he reported that he was not fully using his CPAP machine recently because he had a stomach virus a couple of months ago when he had bronchitis a few weeks ago. Also he was out of town some and did not take his machine with him. He typically does not use the humidifier. He still has a tendency to pull the mask off in the middle of the night and not realize it. He does not like the ramp time. He had gained some weight. We talked about the importance of compliance with CPAP therapy.   I reviewed his compliance data from 03/24/2016 through 04/22/2016 which is a total of 30 days during which time he used his machine 28 days with percent used days greater than 4 hours at 73%, indicating adequate compliance with an average usage for all days of 4 hours and 55 minutes, residual AHI 1.9  per hour, leak at times high with the 95th percentile at 45.5 L/m on a pressure of 13 cm.   I saw him on 04/10/2015, at which time he reported doing fairly well. He had adjusted reasonably well to CPAP therapy. He was adequately compliant with treatment. He had gained a few pounds. He did indicate sleeping better and having less daytime somnolence with CPAP therapy.   I reviewed his CPAP compliance data from 09/18/2015 through 10/17/2015 which is a total of 30 days during which time he used his machine 21 days with percent used days greater than 4 hours at home today for 47%, indicating suboptimal compliance with an  average usage of 3 hours and 12 minutes, residual AHI at 1.3 per hour, leak borderline for the 95th percentile at 25.5 L/m on a pressure of 13 cm.    I first met him on 01/06/2015 at the request of his primary care provider, at which time the patient reported a previous diagnosis of obstructive sleep apnea. His last study was over 12 years ago when he had an old CPAP machine and needed reevaluation. I invited him back for a sleep study. He had a split-night sleep study on 01/19/2015 underwent over his test results with him in detail today. Sleep efficiency at baseline was 84.7% with a latency to sleep of 2.5 minutes and wake after sleep onset of 18.5 minutes with mild to moderate sleep fragmentation noted. He had a markedly elevated arousal index secondary to respiratory events. He had absence of slow-wave sleep and REM sleep prior to CPAP initiation. He had no significant PLMS, EKG or EEG changes. He had mild to moderate snoring. Total AHI was elevated at 71.1 per hour, baseline oxygen saturation 89% only, nadir of 74%. He was then titrated on CPAP. Sleep efficiency was 95.5%, arousal index normal, he achieved significant slow-wave sleep and REM sleep after CPAP was initiated. Average oxygen saturation improved at 91% with a nadir of 81%. Mild PLMS were noted without arousals. CPAP was titrated from 6-12 cm. His AHI was reduced to 6 per hour at the final pressure with supine REM sleep achieved during the study. Based on his test results and residual mild sleep disordered breathing noted on the final pressure, I prescribed CPAP at 13 cm via nasal pillows.    I reviewed his CPAP compliance data from 03/11/2015 through 04/09/2015 which is a total of 30 days during which time he used his machine 28 days with percent used days greater than 4 hours of 73%, indicating adequate compliance, with an average usage of 4 hours and 58 minutes for all nights, residual AHI at 1.8 per hour, leak at times high with the 95th  percentile at 38.4 L/m on a pressure of 13 cm.   The patient was diagnosed with obstructive sleep apnea several years ago. His last sleep study was about 12 years ago and he was advised to use CPAP at the time. He has been on CPAP therapy for about 12 years now. His machine is old. He has a tendency to take his mask off in the early morning hours and has not been very good about putting it back on. He does use it every night as far as putting the mask on. For the longest time he has been using a nasal mask but some 4 or 5 months ago he started using a nasal pillows mask which he prefers. He reports a pressure of 16 cm. As far as he can remember he  was diagnosed with significant obstructive sleep apnea. I do not have test results from his previous sleep study available for review today. He reports an approximate 15 pound weight gain since his original sleep study. He would like to get an updated machine and updated settings. He does endorse daytime somnolence with an Epworth sleepiness score of 14 out of 24 today. He denies restless leg symptoms but does have occasional leg cramps at night. He may not be drinking enough water he endorses. He Drinks coffee occasionally maybe once or twice per week. He does not smoke. He drinks alcohol occasionally. He likes to nap if he can. Bedtime usually is 9:51 PM he falls asleep fairly quickly. He lives alone and sleeps alone typically. His wake time is 5 AM and he does not always wake up rested. He denies morning headaches or significant nocturia. He is not aware of any family history of RLS or obstructive sleep apnea. 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 He gets his supplies from [employer] in [place].   Some 20 years ago he had nasal surgery. From what I can tell, he had septum deviation repair and inferior turbinate reduction. He does suffer from allergies and nasal congestion. He takes Claritin occasionally. He has used  nasal saline rinses. He denies using Afrin.   His Past Medical History Is Significant For: Past Medical History:  Diagnosis Date  . Anxiety   . Back pain   . Sleep apnea    His Past Surgical History Is Significant For: R cataract surgery.   His Family History Is Significant For: Family History  Problem Relation Age of Onset  . Hypertension Mother   . Hyperlipidemia Mother   . Heart attack Mother   . Thyroid cancer Sister     His Social History Is Significant For: Social History   Socioeconomic History  . Marital status: Single    Spouse name: Not on file  . Number of children: Not on file  . Years of education: Not on file  . Highest education level: Not on file  Occupational History  . Not on file  Social Needs  . Financial resource strain: Not on file  . Food insecurity:    Worry: Not on file    Inability: Not on file  . Transportation needs:    Medical: Not on file    Non-medical: Not on file  Tobacco Use  . Smoking status: Never Smoker  . Smokeless tobacco: Never Used  Substance and Sexual Activity  . Alcohol use: No    Alcohol/week: 0.0 standard drinks    Comment: 3-4 drinks a week   . Drug use: Not on file  . Sexual activity: Not on file  Lifestyle  . Physical activity:    Days per week: Not on file    Minutes per session: Not on file  . Stress: Not on file  Relationships  . Social connections:    Talks on phone: Not on file    Gets together: Not on file    Attends religious service: Not on file    Active member of club or organization: Not on file    Attends meetings of clubs or organizations: Not on file    Relationship status: Not on file  Other Topics Concern  . Not on file  Social History Narrative   1-2  Caffeine drinks a week     His Allergies Are:  No Known Allergies:  His Current Medications Are:  Outpatient Encounter Medications as of 10/01/2018  Medication Sig  . aspirin EC 81 MG tablet Take 81 mg by mouth daily.  . meloxicam  (MOBIC) 7.5 MG tablet Take 1 tablet by mouth 2 (two) times daily as needed.   No facility-administered encounter medications on file as of 10/01/2018.   :  Review of Systems:  Out of a complete 14 point review of systems, all are reviewed and negative with the exception of these symptoms as listed below: Review of Systems  Neurological:       Pt presents today to discuss his cpap. Pt doesn't think he gets enough sleep because he feels like he needs a nap around 10am.    Objective:  Neurological Exam  Physical Exam Physical Examination:   Vitals:   10/01/18 1347  BP: 131/88  Pulse: 67    General Examination: The patient is a very pleasant 63 y.o. male in no acute distress. He appears well-developed and well-nourished and well groomed.   HEENT:Normocephalic, atraumatic, pupils are equal, round and reactive to light and accommodation. Status post right cataract surgery, mild left cataract noted. Extraocular tracking is good without limitation to gaze excursion or nystagmus noted. Normal smooth pursuit is noted. Hearing is grossly intact. Face is symmetric with normal facial animation and normal facial sensation. Speech is clear with no dysarthria noted. There is no hypophonia. There is no lip, neck/head, jaw or voice tremor. Neck shows FROM. Oropharynx exam reveals: mild to moderate mouth dryness, good dental hygiene and moderate airway crowding. Mallampati is class II. Tongue protrudes centrally and palate elevates symmetrically.    Chest:Clear to auscultation without wheezing, rhonchi or crackles noted.  Heart:S1+S2+0, regular and normal without murmurs, rubs or gallops noted.   Abdomen:Soft, non-tender and non-distended with normal bowel sounds appreciated on auscultation.  Extremities:There is no pitting edema in the distal lower extremities bilaterally, stable.   Skin: Warm and dry without trophic changes noted. There are no varicose veins.  Musculoskeletal: exam  reveals no obvious joint deformities, tenderness or joint swelling or erythema.   Neurologically:  Mental status: The patient is awake, alert and oriented in all 4 spheres. His immediate and remote memory, attention, language skills and fund of knowledge are appropriate. There is no evidence of aphasia, agnosia, apraxia or anomia. Speech is clear with normal prosody and enunciation. Thought process is linear. Mood is normal and affect is normal.  Cranial nerves II - XII are as described above under HEENT exam.  Motor exam: Normal bulk, strength and tone is noted. There is no tremor. Fine motor skills and coordination: grossly intact.  Cerebellar testing: No dysmetria or intention tremor.  Sensory exam: intact to light touch in the upper and lower extremities.  Gait, station and balance: He stands easily. No veering to one side is noted. No leaning to one side is noted. Posture is age-appropriate and stance is narrow based. Gait shows normal stride length and normal pace. No problems turning are noted.    Assessment and Plan:  In summary, Willie Ortega is a very pleasant 63 year old male with an underlying medical history of anxiety, low back pain and obesity, whopresents for follow-up consultation of his obstructive sleep apnea, well established on CPAP therapy with ongoing excellent compliance. He is commended for his treatment adherence. Of note, he had a split-night sleep study on 01/19/2015 and did well with CPAP therapy. He continues to endorse good results, but feels like he may not get  enough sleep. Average usage is indeed 5-1/2 hours, looking at the past 30 days. Last year around this time is average usage was 5 hours and 55 minutes and leak was better. Of note, he is due for new supplies and is reminded to be regular with his supply exchange. He has been changing the filter about 4 times a year he indicates. He is encouraged to try to change the filter every 1 or 2 months. He should try to  get a new water chamber as well as a new hose. He definitely should try to get new nasal pillows. He does continue to have issues with high leak from the mask and overusing the mask can lead to more leak. I will place an order for new supplies through is DME company and also a mask refit. Pressure is adequate at 13 cm at this time. He does use melatonin prn and does not feel he sleeps longer with it. He is encouraged to continue to strive for weight loss, compared to last year he has lost about 10 pounds. He is advised to routinely follow-up in one year, sooner if needed, he can see one of our nurse practitioners next time. I answered all his questions today and he was in agreement. I spent 20 minutes in total face-to-face time with the patient, more than 50% of which was spent in counseling and coordination of care, reviewing test results, reviewing medication and discussing or reviewing the diagnosis of OSA, its prognosis and treatment options. Pertinent laboratory and imaging test results that were available during this visit with the patient were reviewed by me and considered in my medical decision making (see chart for details).

## 2018-10-01 NOTE — Patient Instructions (Addendum)
You have continued to do well on CPAP, please be sure to change your filter every 1-2 months, your mask about every 3 months, hose about 6 months, humidifier chamber about yearly. Some restrictions are imposed by her insurance carrier with regard to how frequently you can get certain supplies.   Please continue using your CPAP regularly. While your insurance requires that you use CPAP at least 4 hours each night on 70% of the nights, I recommend, that you not skip any nights and use it throughout the night if you can. Getting used to CPAP and staying with the treatment long term does take time and patience and discipline. Untreated obstructive sleep apnea when it is moderate to severe can have an adverse impact on cardiovascular health and raise her risk for heart disease, arrhythmias, hypertension, congestive heart failure, stroke and diabetes. Untreated obstructive sleep apnea causes sleep disruption, nonrestorative sleep, and sleep deprivation. This can have an impact on your day to day functioning and cause daytime sleepiness and impairment of cognitive function, memory loss, mood disturbance, and problems focussing. Using CPAP regularly can improve these symptoms.  We can see you in 1 year, you can see one of our nurse practitioners as you are stable.

## 2018-10-02 DIAGNOSIS — G4733 Obstructive sleep apnea (adult) (pediatric): Secondary | ICD-10-CM | POA: Diagnosis not present

## 2018-10-30 DIAGNOSIS — G4733 Obstructive sleep apnea (adult) (pediatric): Secondary | ICD-10-CM | POA: Diagnosis not present

## 2019-10-07 ENCOUNTER — Ambulatory Visit: Payer: 59 | Admitting: Adult Health

## 2019-12-09 ENCOUNTER — Ambulatory Visit: Payer: 59 | Admitting: Adult Health

## 2020-04-13 ENCOUNTER — Ambulatory Visit: Payer: Self-pay | Admitting: Adult Health

## 2020-12-12 ENCOUNTER — Ambulatory Visit (INDEPENDENT_AMBULATORY_CARE_PROVIDER_SITE_OTHER): Payer: No Typology Code available for payment source | Admitting: Allergy

## 2020-12-12 ENCOUNTER — Other Ambulatory Visit: Payer: Self-pay

## 2020-12-12 ENCOUNTER — Encounter: Payer: Self-pay | Admitting: Allergy

## 2020-12-12 VITALS — BP 136/80 | HR 71 | Temp 97.2°F | Resp 16 | Ht 75.39 in | Wt 294.0 lb

## 2020-12-12 DIAGNOSIS — H1013 Acute atopic conjunctivitis, bilateral: Secondary | ICD-10-CM | POA: Insufficient documentation

## 2020-12-12 DIAGNOSIS — J3089 Other allergic rhinitis: Secondary | ICD-10-CM | POA: Diagnosis not present

## 2020-12-12 DIAGNOSIS — J302 Other seasonal allergic rhinitis: Secondary | ICD-10-CM | POA: Insufficient documentation

## 2020-12-12 MED ORDER — LEVOCETIRIZINE DIHYDROCHLORIDE 5 MG PO TABS: 5.0000 mg | ORAL_TABLET | Freq: Every evening | ORAL | 5 refills | Status: DC

## 2020-12-12 MED ORDER — AZELASTINE HCL 0.1 % NA SOLN: 1.0000 | Freq: Two times a day (BID) | NASAL | 5 refills | Status: DC | PRN

## 2020-12-12 MED ORDER — MONTELUKAST SODIUM 10 MG PO TABS: 10.0000 mg | ORAL_TABLET | Freq: Every day | ORAL | 5 refills | Status: DC

## 2020-12-12 NOTE — Patient Instructions (Addendum)
Today's skin testing showed: Positive to grass pollen, ragweed pollen, weed pollen, tree pollen, mold, dust mites, cats.   Environmental allergies  Start environmental control measures as below. Start Singulair (montelukast) 10mg  daily at night. Cautioned that in some children/adults can experience behavioral changes including hyperactivity, agitation, depression, sleep disturbances and suicidal ideations. These side effects are rare, but if you notice them you should notify me and discontinue Singulair (montelukast).   May use over the counter antihistamines such as Zyrtec (cetirizine), Claritin (loratadine), Allegra (fexofenadine), or Xyzal (levocetirizine) daily. May take twice a day during flares.  START Flonase (fluticasone) nasal spray 1 spray per nostril twice a day for nasal congestion.   May use azelastine nasal spray 1-2 sprays per nostril twice a day as needed for runny nose/drainage.  Nasal saline spray (i.e., Simply Saline) or nasal saline lavage (i.e., NeilMed) is recommended as needed and prior to medicated nasal sprays.  Read about allergy injections - handout given.   Follow up in 2-3 months or sooner if needed in the [place] office.   Control of House Dust Mite Allergen . Dust mite allergens are a common trigger of allergy and asthma symptoms. While they can be found throughout the house, these microscopic creatures thrive in warm, humid environments such as bedding, upholstered furniture and carpeting. . Because so much time is spent in the bedroom, it is essential to reduce mite levels there.  . Encase pillows, mattresses, and box springs in special allergen-proof fabric covers or airtight, zippered plastic covers.  . Bedding should be washed weekly in hot water (130 F) and dried in a hot dryer. Allergen-proof covers are available for comforters and pillows that can't be regularly washed.  Garrison the allergy-proof covers every few months. Minimize clutter in the  bedroom. Keep pets out of the bedroom.  Reyes Ivan Keep humidity less than 50% by using a dehumidifier or air conditioning. You can buy a humidity measuring device called a hygrometer to monitor this.  . If possible, replace carpets with hardwood, linoleum, or washable area rugs. If that's not possible, vacuum frequently with a vacuum that has a HEPA filter. . Remove all upholstered furniture and non-washable window drapes from the bedroom. . Remove all non-washable stuffed toys from the bedroom.  Wash stuffed toys weekly. Reducing Pollen Exposure . Pollen seasons: trees (spring), grass (summer) and ragweed/weeds (fall). 01-16-1980 Keep windows closed in your home and car to lower pollen exposure.  Marland Kitchen air conditioning in the bedroom and throughout the house if possible.  . Avoid going out in dry windy days - especially early morning. . Pollen counts are highest between 5 - 10 AM and on dry, hot and windy days.  . Save outside activities for late afternoon or after a heavy rain, when pollen levels are lower.  . Avoid mowing of grass if you have grass pollen allergy. Lilian Kapur Be aware that pollen can also be transported indoors on people and pets.  . Dry your clothes in an automatic dryer rather than hanging them outside where they might collect pollen.  . Rinse hair and eyes before bedtime. Mold Control . Mold and fungi can grow on a variety of surfaces provided certain temperature and moisture conditions exist.  . Outdoor molds grow on plants, decaying vegetation and soil. The major outdoor mold, Alternaria and Cladosporium, are found in very high numbers during hot and dry conditions. Generally, a late summer - fall peak is seen for common outdoor fungal spores. Rain will temporarily lower  outdoor mold spore count, but counts rise rapidly when the rainy period ends. . The most important indoor molds are Aspergillus and Penicillium. Dark, humid and poorly ventilated basements are ideal sites for mold growth. The  next most common sites of mold growth are the bathroom and the kitchen. Outdoor (Seasonal) Mold Control . Use air conditioning and keep windows closed. . Avoid exposure to decaying vegetation. Marland Kitchen Avoid leaf raking. . Avoid grain handling. . Consider wearing a face mask if working in moldy areas.  Indoor (Perennial) Mold Control  . Maintain humidity below 50%. . Get rid of mold growth on hard surfaces with water, detergent and, if necessary, 5% bleach (do not mix with other cleaners). Then dry the area completely. If mold covers an area more than 10 square feet, consider hiring an indoor environmental professional. . For clothing, washing with soap and water is best. If moldy items cannot be cleaned and dried, throw them away. . Remove sources e.g. contaminated carpets. . Repair and seal leaking roofs or pipes. Using dehumidifiers in damp basements may be helpful, but empty the water and clean units regularly to prevent mildew from forming. All rooms, especially basements, bathrooms and kitchens, require ventilation and cleaning to deter mold and mildew growth. Avoid carpeting on concrete or damp floors, and storing items in damp areas. Pet Allergen Avoidance: . Contrary to popular opinion, there are no "hypoallergenic" breeds of dogs or cats. That is because people are not allergic to an animal's hair, but to an allergen found in the animal's saliva, dander (dead skin flakes) or urine. Pet allergy symptoms typically occur within minutes. For some people, symptoms can build up and become most severe 8 to 12 hours after contact with the animal. People with severe allergies can experience reactions in public places if dander has been transported on the pet owners' clothing. Marland Kitchen Keeping an animal outdoors is only a partial solution, since homes with pets in the yard still have higher concentrations of animal allergens. . Before getting a pet, ask your allergist to determine if you are allergic to animals.  If your pet is already considered part of your family, try to minimize contact and keep the pet out of the bedroom and other rooms where you spend a great deal of time. . As with dust mites, vacuum carpets often or replace carpet with a hardwood floor, tile or linoleum. . High-efficiency particulate air (HEPA) cleaners can reduce allergen levels over time. . While dander and saliva are the source of cat and dog allergens, urine is the source of allergens from rabbits, hamsters, mice and [place] pigs; so ask a non-allergic family member to clean the animal's cage. . If you have a pet allergy, talk to your allergist about the potential for allergy immunotherapy (allergy shots). This strategy can often provide long-term relief.

## 2020-12-12 NOTE — Progress Notes (Signed)
New Patient Note  RE: Willie Ortega MRN: [id number] DOB: 06-06-1956 Date of Office Visit: 12/12/2020  Consult requested by: Tessie EkeHanvey, Shannon J, PA Primary care provider: Elfredia NevinsFusco, Lawrence, MD  Chief Complaint: Nasal Congestion (Constant runny nose)  History of Present Illness: I had the pleasure of seeing Willie Ortega for initial evaluation at the [facility] on 12/13/2020. He is a 65 y.o. male, who is referred here by Elfredia NevinsFusco, Lawrence, MD for the evaluation of allergic rhinitis.  He reports symptoms of nasal congestion, rhinorrhea, sneezing, itchy nose, itchy/watery eyes. Symptoms have been going on for 10 years. The symptoms are present all year around with worsening in spring. Other triggers include exposure to weather changes. Anosmia: no. Headache: no. He has used OTC antihistamines, Flonase 1 spray prn  with some improvement in symptoms. Sinus infections: a couple. Previous work up includes: 2022 bloodwork positive to dust mites. Previous ENT evaluation: deviated septum repair 15 years ago. Previous sinus imaging: not recently. History of nasal polyps: possibly. Last eye exam: within the past year. History of reflux: no.  Assessment and Plan: Willie Ortega is a 65 y.o. male with: Other allergic rhinitis Perennial rhinoconjunctivitis symptoms for 10+ years with worsening in the spring.  Tried over-the-counter antihistamines and Flonase with some benefit.  2022 blood work was positive to dust mites.  Deviated septum surgery 15 years ago.  Today's skin testing showed: Positive to grass pollen, ragweed pollen, weed pollen, tree pollen, mold, dust mites, cats.   Significant turbinate hypertrophy bilaterally on physical exam today - if no improvement with below regiment will refer to ENT next.   Start environmental control measures as below. Start Singulair (montelukast) 10mg  daily at night. Cautioned that in some children/adults can experience behavioral changes including  hyperactivity, agitation, depression, sleep disturbances and suicidal ideations. These side effects are rare, but if you notice them you should notify me and discontinue Singulair (montelukast).  May use over the counter antihistamines such as Zyrtec (cetirizine), Claritin (loratadine), Allegra (fexofenadine), or Xyzal (levocetirizine) daily. May take twice a day during flares.  START Flonase (fluticasone) nasal spray 1 spray per nostril twice a day for nasal congestion.   May use azelastine nasal spray 1-2 sprays per nostril twice a day as needed for runny nose/drainage.  Nasal saline spray (i.e., Simply Saline) or nasal saline lavage (i.e., NeilMed) is recommended as needed and prior to medicated nasal sprays.  Discussed with patient that he would be a good candidate for allergy immunotherapy - hesitant due to the time commitment issue. Handout give on [facility]IT.  Allergic conjunctivitis of both eyes  See assessment and plan as above for allergic rhinitis.  Return in about 2 months (around 02/11/2021).  Meds ordered this encounter  Medications  . azelastine (ASTELIN) 0.1 % nasal spray    Sig: Place 1-2 sprays into both nostrils 2 (two) times daily as needed (nasal drainage). Use in each nostril as directed    Dispense:  30 mL    Refill:  5  . montelukast (SINGULAIR) 10 MG tablet    Sig: Take 1 tablet (10 mg total) by mouth at bedtime.    Dispense:  30 tablet    Refill:  5  . levocetirizine (XYZAL) 5 MG tablet    Sig: Take 1 tablet (5 mg total) by mouth every evening.    Dispense:  30 tablet    Refill:  5   Lab Orders  No laboratory test(s) ordered today    Other allergy  screening: Asthma: no Food allergy: no Medication allergy: no Hymenoptera allergy: no Urticaria: no Eczema:no History of recurrent infections suggestive of immunodeficency: no  Diagnostics: Skin Testing: Environmental allergy panel. Positive to grass pollen, ragweed pollen, weed pollen, tree pollen, mold,  dust mites, cats.  Results discussed with patient/family.  Airborne Adult Perc - 12/12/20 1359    Time Antigen Placed 1400    Allergen Manufacturer Waynette Buttery    Location Back    Number of Test 59    1. Control-Buffer 50% Glycerol Negative    2. Control-Histamine 1 mg/ml 2+    3. Albumin saline Negative    4. Willie Ortega --   +/-   5. Willie Ortega Negative    6. Johnson Negative    7. Kentucky Blue Negative    8. Meadow Fescue 2+    9. Perennial Rye 2+    10. Sweet Vernal Negative    11. Timothy Negative    12. Cocklebur Negative    13. Burweed Marshelder Negative    14. Ragweed, short Negative    15. Ragweed, Giant 2+    16. Plantain,  English Negative    17. Lamb's Quarters Negative    18. Sheep Sorrell Negative    19. Rough Pigweed Negative    20. Marsh Elder, Rough Negative    21. Mugwort, Common Negative    22. Ash mix Negative    23. Birch mix Negative    24. Beech American Negative    25. Box, Elder 2+    26. Cedar, red Negative    27. Cottonwood, Guinea-Bissau Negative    28. Elm mix Negative    29. Hickory Negative    30. Maple mix 2+    31. Oak, Guinea-Bissau mix 2+    32. Pecan Pollen Negative    33. Pine mix Negative    34. Sycamore Eastern Negative    35. Walnut, Black Pollen Negative    36. Alternaria alternata Negative    37. Cladosporium Herbarum Negative    38. Aspergillus mix Negative    39. Penicillium mix Negative    40. Bipolaris sorokiniana (Helminthosporium) 2+    41. Drechslera spicifera (Curvularia) 2+    42. Mucor plumbeus 2+    43. Fusarium moniliforme Negative    44. Aureobasidium pullulans (pullulara) Negative    45. Rhizopus oryzae Negative    46. Botrytis cinera Negative    47. Epicoccum nigrum 2+    48. Phoma betae 2+    49. Candida Albicans 2+    50. Trichophyton mentagrophytes 2+    51. Mite, D Farinae  5,000 AU/ml 4+    52. Mite, D Pteronyssinus  5,000 AU/ml 4+    53. Cat Hair 10,000 BAU/ml 3+    54.  Dog Epithelia Negative    55. Mixed Feathers  Negative    56. Horse Epithelia Negative    57. Cockroach, German Negative    58. Mouse Negative    59. Tobacco Leaf Negative          Intradermal - 12/12/20 1434    Time Antigen Placed 1434    Allergen Manufacturer Greer    Location Arm    Number of Test 9    Control Negative    Willie Ortega Negative    Johnson Negative    Weed mix 3+    Mold 1 --   +/-   Mold 2 Negative    Mold 4 Negative    Dog Negative    Cockroach Negative  Past Medical History: Patient Active Problem List   Diagnosis Date Noted  . Other allergic rhinitis 12/12/2020  . Allergic conjunctivitis of both eyes 12/12/2020   Past Medical History:  Diagnosis Date  . Anxiety   . Back pain   . Sleep apnea    Past Surgical History: Past Surgical History:  Procedure Laterality Date  . NASAL SEPTUM SURGERY    . NOSE SURGERY     Medication List:  Current Outpatient Medications  Medication Sig Dispense Refill  . atorvastatin (LIPITOR) 10 MG tablet Take 10 mg by mouth daily.    Marland Kitchen azelastine (ASTELIN) 0.1 % nasal spray Place 1-2 sprays into both nostrils 2 (two) times daily as needed (nasal drainage). Use in each nostril as directed 30 mL 5  . Cholecalciferol (VITAMIN D3) 50 MCG (2000 UT) CAPS Take by mouth.    . levocetirizine (XYZAL) 5 MG tablet Take 1 tablet (5 mg total) by mouth every evening. 30 tablet 5  . montelukast (SINGULAIR) 10 MG tablet Take 1 tablet (10 mg total) by mouth at bedtime. 30 tablet 5  . Turmeric (QC TUMERIC COMPLEX) 500 MG CAPS Take by mouth.     No current facility-administered medications for this visit.   Allergies: No Known Allergies Social History: Social History   Socioeconomic History  . Marital status: Single    Spouse name: Not on file  . Number of children: Not on file  . Years of education: Not on file  . Highest education level: Not on file  Occupational History  . Not on file  Tobacco Use  . Smoking status: Never Smoker  . Smokeless tobacco: Never  Used  Vaping Use  . Vaping Use: Never used  Substance and Sexual Activity  . Alcohol use: Yes    Alcohol/week: 0.0 standard drinks    Comment: 3-4 drinks a week   . Drug use: Never  . Sexual activity: Not on file  Other Topics Concern  . Not on file  Social History Narrative   1-2  Caffeine drinks a week    Social Determinants of Health   Financial Resource Strain: Not on file  Food Insecurity: Not on file  Transportation Needs: Not on file  Physical Activity: Not on file  Stress: Not on file  Social Connections: Not on file   Lives in a 64 year old house. Smoking: denies Occupation: retired  Landscape architect HistorySurveyor, minerals in the house: no Engineer, civil (consulting) in the family room: no Carpet in the bedroom: yes Heating: gas Cooling: central Pet: no  Family History: Family History  Problem Relation Age of Onset  . Hypertension Mother   . Hyperlipidemia Mother   . Heart attack Mother   . Thyroid cancer Sister   . Allergic rhinitis Neg Hx   . Angioedema Neg Hx   . Asthma Neg Hx   . Atopy Neg Hx   . Eczema Neg Hx   . Immunodeficiency Neg Hx   . Urticaria Neg Hx    Review of Systems  Constitutional: Negative for appetite change, chills, fever and unexpected weight change.  HENT: Positive for congestion, rhinorrhea and sneezing.   Eyes: Positive for itching.  Respiratory: Negative for cough, chest tightness, shortness of breath and wheezing.   Cardiovascular: Negative for chest pain.  Gastrointestinal: Negative for abdominal pain.  Genitourinary: Negative for difficulty urinating.  Skin: Negative for rash.  Allergic/Immunologic: Positive for environmental allergies.  Neurological: Negative for headaches.   Objective: BP 136/80   Pulse 71  Temp (!) 97.2 F (36.2 C) (Temporal)   Resp 16   Ht 6' 3.39" (1.915 m)   Wt 294 lb (133.4 kg)   SpO2 94%   BMI 36.36 kg/m  Body mass index is 36.36 kg/m. Physical Exam Vitals and nursing note reviewed.   Constitutional:      Appearance: Normal appearance. He is well-developed.  HENT:     Head: Normocephalic and atraumatic.     Right Ear: External ear normal.     Left Ear: External ear normal.     Ears:     Comments: Bilateral cerumen    Nose: Congestion and rhinorrhea present.     Comments: Bilateral turbinate hypertrophy.    Mouth/Throat:     Mouth: Mucous membranes are moist.     Pharynx: Oropharynx is clear.  Eyes:     Conjunctiva/sclera: Conjunctivae normal.  Cardiovascular:     Rate and Rhythm: Normal rate and regular rhythm.     Heart sounds: Normal heart sounds. No murmur heard. No friction rub. No gallop.   Pulmonary:     Effort: Pulmonary effort is normal.     Breath sounds: Normal breath sounds. No wheezing, rhonchi or rales.  Musculoskeletal:     Cervical back: Neck supple.  Skin:    General: Skin is warm.     Findings: No rash.  Neurological:     Mental Status: He is alert and oriented to person, place, and time.  Psychiatric:        Behavior: Behavior normal.    The plan was reviewed with the patient/family, and all questions/concerned were addressed.  It was my pleasure to see Dardan today and participate in his care. Please feel free to contact me with any questions or concerns.  Sincerely,  Wyline Mood, DO Allergy & Immunology  [facility] office: [phone] [facility] office: [phone]

## 2020-12-13 ENCOUNTER — Encounter: Payer: Self-pay | Admitting: Allergy

## 2020-12-13 NOTE — Assessment & Plan Note (Signed)
Perennial rhinoconjunctivitis symptoms for 10+ years with worsening in the spring.  Tried over-the-counter antihistamines and Flonase with some benefit.  2022 blood work was positive to dust mites.  Deviated septum surgery 15 years ago.  Today's skin testing showed: Positive to grass pollen, ragweed pollen, weed pollen, tree pollen, mold, dust mites, cats.   Significant turbinate hypertrophy bilaterally on physical exam today - if no improvement with below regiment will refer to ENT next.   Start environmental control measures as below. Start Singulair (montelukast) 10mg  daily at night. Cautioned that in some children/adults can experience behavioral changes including hyperactivity, agitation, depression, sleep disturbances and suicidal ideations. These side effects are rare, but if you notice them you should notify me and discontinue Singulair (montelukast).  May use over the counter antihistamines such as Zyrtec (cetirizine), Claritin (loratadine), Allegra (fexofenadine), or Xyzal (levocetirizine) daily. May take twice a day during flares.  START Flonase (fluticasone) nasal spray 1 spray per nostril twice a day for nasal congestion.   May use azelastine nasal spray 1-2 sprays per nostril twice a day as needed for runny nose/drainage.  Nasal saline spray (i.e., Simply Saline) or nasal saline lavage (i.e., NeilMed) is recommended as needed and prior to medicated nasal sprays.  Discussed with patient that he would be a good candidate for allergy immunotherapy - hesitant due to the time commitment issue. Handout give on [facility]IT.

## 2020-12-13 NOTE — Assessment & Plan Note (Signed)
   See assessment and plan as above for allergic rhinitis.  

## 2020-12-14 ENCOUNTER — Encounter: Payer: Self-pay | Admitting: Allergy

## 2021-02-14 ENCOUNTER — Ambulatory Visit (INDEPENDENT_AMBULATORY_CARE_PROVIDER_SITE_OTHER): Payer: No Typology Code available for payment source | Admitting: Allergy & Immunology

## 2021-02-14 ENCOUNTER — Telehealth: Payer: Self-pay | Admitting: *Deleted

## 2021-02-14 ENCOUNTER — Other Ambulatory Visit: Payer: Self-pay

## 2021-02-14 ENCOUNTER — Encounter: Payer: Self-pay | Admitting: Allergy & Immunology

## 2021-02-14 VITALS — BP 142/86 | HR 74 | Temp 97.6°F | Resp 18

## 2021-02-14 DIAGNOSIS — J3089 Other allergic rhinitis: Secondary | ICD-10-CM | POA: Diagnosis not present

## 2021-02-14 DIAGNOSIS — J302 Other seasonal allergic rhinitis: Secondary | ICD-10-CM | POA: Diagnosis not present

## 2021-02-14 MED ORDER — LEVOCETIRIZINE DIHYDROCHLORIDE 5 MG PO TABS: 5.0000 mg | ORAL_TABLET | Freq: Two times a day (BID) | ORAL | 5 refills | Status: DC | PRN

## 2021-02-14 MED ORDER — AZELASTINE HCL 0.1 % NA SOLN: 1.0000 | Freq: Two times a day (BID) | NASAL | 5 refills | Status: DC | PRN

## 2021-02-14 MED ORDER — MONTELUKAST SODIUM 10 MG PO TABS: 10.0000 mg | ORAL_TABLET | Freq: Every day | ORAL | 5 refills | Status: DC

## 2021-02-14 NOTE — Progress Notes (Signed)
Dollene Cleveland has sent a medical release form to the [facility]. Once the records come in please give to Dr Dellis Anes.  Thanks

## 2021-02-14 NOTE — Patient Instructions (Addendum)
1. Seasonal and perennial allergic rhinitis (grass pollen, ragweed pollen, weed pollen, tree pollen, mold, dust mites, cats) - Continue with Flonase one spay per nostril twice daily.  -Continue with Astelin one spray per nostril twice daily. - Continue with Singulair 10mg  daily, - Continue with Xyzal 5 mg 1-2 times daily. - We are going to get your records from the to see if you have had a sinus CT recently. - I cannot see polyps because your turbinates are so enlarged.  - We could consider Dupixent if there is evidence of nasal polyps.   2. Return in about 6 months (around 08/16/2021).    Please inform 14/04/2021 of any Emergency Department visits, hospitalizations, or changes in symptoms. Call us before going to the ED for breathing or allergy symptoms since we might be able to fit you in for a sick visit. Feel free to contact us anytime with any questions, problems, or concerns.  It was a pleasure to meet you today!  Websites that have reliable patient information: 1. American Academy of Asthma, Allergy, and Immunology: www.aaaai.org 2. Food Allergy Research and Education (FARE): foodallergy.org 3. Mothers of Asthmatics: http://www.asthmacommunitynetwork.org 4. American College of Allergy, Asthma, and Immunology: www.acaai.org   COVID-19 Vaccine Information can be found at: [place] For questions related to vaccine distribution or appointments, please email [email or web address] or call [phone].   We realize that you might be concerned about having an allergic reaction to the COVID19 vaccines. To help with that concern, WE ARE OFFERING THE COVID19 VACCINES IN OUR OFFICE! Ask the front desk for dates!     "Like" [phone] on Facebook and Instagram for our latest updates!      A healthy democracy works best when [place] participate! Make sure you are registered to vote! If you have moved or changed any of your contact  information, you will need to get this updated before voting!  In some cases, you MAY be able to register to vote online: [employer]

## 2021-02-14 NOTE — Telephone Encounter (Signed)
Medical Records Request for Sinus CT has been faxed to the [facility] at [phone].

## 2021-02-14 NOTE — Progress Notes (Signed)
FOLLOW UP  Date of Service/Encounter:  02/14/21   Assessment:   Perennial and seasonal allergic rhinitis (grass pollen, ragweed pollen, weed pollen, tree pollen, mold, dust mites, cats)  Plan/Recommendations:   1. Seasonal and perennial allergic rhinitis (grass pollen, ragweed pollen, weed pollen, tree pollen, mold, dust mites, cats) - Continue with Flonase one spay per nostril twice daily.  -Continue with Astelin one spray per nostril twice daily. - Continue with Singulair 10mg  daily, - Continue with Xyzal 5 mg 1-2 times daily. - We are going to get your records from the to see if you have had a sinus CT recently. - I cannot see polyps because your turbinates are so enlarged.  - We could consider Dupixent if there is evidence of nasal polyps.   2. Return in about 6 months (around 08/16/2021).   Subjective:   Willie Ortega is a 65 y.o. male presenting today for follow up of  Chief Complaint  Patient presents with  . Follow-up    Willie Ortega has a history of the following: Patient Active Problem List   Diagnosis Date Noted  . Other allergic rhinitis 12/12/2020  . Allergic conjunctivitis of both eyes 12/12/2020    History obtained from: chart review and patient.  Willie Ortega is a 65 y.o. male presenting for a follow up visit.  He was last seen in April 2022 by Dr. May 2022.  He had testing that was positive grass pollen, ragweed pollen, weed pollen, tree pollen, mold, dust mites, cats.  He was started on Singulair 10 mg at night as well as Flonase 1 spray per nostril twice daily.  He was also started on Astelin 1 to 2 sprays per nostril up to twice daily.  Since the last visit, he has done very well. He is "nasally" today but needs a refill on the Flonase and the tablets. He is going to pick them up tomorrow. He does feel that the nasal spray goes to back of his throat.  He is not interested in allergy shots at all.  He is wondering whether he has polyps. He does report  that he had a sinus CT at some point. This was in the Willie Ortega system.  The Flonase has helped, but he does not feel that he can breathe through his nose still.  He does have a history of obstructive sleep apnea and uses a CPAP every night.  He feels like this pushes a lot of mucus down into his sinuses.  He has never had ENT surgery.  He was in the [place] for four years from 1979 until 1983. He got tired of people telling him what to do. He did 1984 work until he retired. He quit working last year and has been enjoying his life since then.   Otherwise, there have been no changes to his past medical history, surgical history, family history, or social history.    Review of Systems  Constitutional: Negative.  Negative for chills, fever, malaise/fatigue and weight loss.  HENT: Positive for congestion. Negative for ear discharge, ear pain and sinus pain.   Eyes: Negative for pain, discharge and redness.  Respiratory: Negative for cough, sputum production, shortness of breath and wheezing.   Cardiovascular: Negative.  Negative for chest pain and palpitations.  Gastrointestinal: Negative for abdominal pain, constipation, diarrhea, heartburn, nausea and vomiting.  Skin: Negative.  Negative for itching and rash.  Neurological: Negative for dizziness and headaches.  Endo/Heme/Allergies: Positive for environmental allergies. Does not bruise/bleed easily.  Objective:   Blood pressure (!) 142/86, pulse 74, temperature 97.6 F (36.4 C), temperature source Temporal, resp. rate 18, SpO2 96 %. There is no height or weight on file to calculate BMI.   Physical Exam:  Physical Exam Constitutional:      Appearance: He is well-developed. He is obese.  HENT:     Head: Normocephalic and atraumatic.     Right Ear: Tympanic membrane, ear canal and external ear normal.     Left Ear: Tympanic membrane, ear canal and external ear normal.     Nose: Mucosal edema and rhinorrhea present. No nasal  deformity or septal deviation.     Right Turbinates: Enlarged, swollen and pale.     Left Turbinates: Enlarged, swollen and pale.     Right Sinus: No maxillary sinus tenderness or frontal sinus tenderness.     Left Sinus: No maxillary sinus tenderness or frontal sinus tenderness.     Comments: No nasal polyps, although I cannot see past the turbinates very well.    Mouth/Throat:     Mouth: Mucous membranes are not pale and not dry.     Pharynx: Uvula midline.  Eyes:     General:        Right eye: No discharge.        Left eye: No discharge.     Conjunctiva/sclera: Conjunctivae normal.     Right eye: Right conjunctiva is not injected. No chemosis.    Left eye: Left conjunctiva is not injected. No chemosis.    Pupils: Pupils are equal, round, and reactive to light.  Cardiovascular:     Rate and Rhythm: Normal rate and regular rhythm.     Heart sounds: Normal heart sounds.  Pulmonary:     Effort: Pulmonary effort is normal. No tachypnea, accessory muscle usage or respiratory distress.     Breath sounds: Normal breath sounds. No wheezing, rhonchi or rales.     Comments: Moving air well in all lung fields. Chest:     Chest wall: No tenderness.  Lymphadenopathy:     Cervical: No cervical adenopathy.  Skin:    Coloration: Skin is not pale.     Findings: No abrasion, erythema, petechiae or rash. Rash is not papular, urticarial or vesicular.  Neurological:     Mental Status: He is alert.  Psychiatric:        Behavior: Behavior is cooperative.      Diagnostic studies: none       Malachi Bonds, MD  [facility]

## 2021-02-21 NOTE — Progress Notes (Signed)
Called [employer] [place] to see if medical release had been processed or received. That department was out to lunch, but they will call back after 130 today.

## 2021-06-01 ENCOUNTER — Encounter: Payer: Self-pay | Admitting: *Deleted

## 2021-08-17 ENCOUNTER — Encounter: Payer: Self-pay | Admitting: Allergy & Immunology

## 2021-08-17 ENCOUNTER — Other Ambulatory Visit: Payer: Self-pay

## 2021-08-17 ENCOUNTER — Ambulatory Visit (INDEPENDENT_AMBULATORY_CARE_PROVIDER_SITE_OTHER): Payer: No Typology Code available for payment source | Admitting: Allergy & Immunology

## 2021-08-17 ENCOUNTER — Telehealth: Payer: Self-pay

## 2021-08-17 VITALS — BP 108/74 | HR 83 | Temp 97.3°F | Resp 18

## 2021-08-17 DIAGNOSIS — J302 Other seasonal allergic rhinitis: Secondary | ICD-10-CM

## 2021-08-17 DIAGNOSIS — J330 Polyp of nasal cavity: Secondary | ICD-10-CM | POA: Diagnosis not present

## 2021-08-17 DIAGNOSIS — J3089 Other allergic rhinitis: Secondary | ICD-10-CM | POA: Diagnosis not present

## 2021-08-17 MED ORDER — AZELASTINE HCL 0.1 % NA SOLN: 1.0000 | Freq: Two times a day (BID) | NASAL | 11 refills | Status: DC | PRN

## 2021-08-17 MED ORDER — EPINEPHRINE 0.3 MG/0.3ML IJ SOAJ: 0.3000 mg | Freq: Once | INTRAMUSCULAR | 1 refills | Status: AC

## 2021-08-17 MED ORDER — MONTELUKAST SODIUM 10 MG PO TABS: 10.0000 mg | ORAL_TABLET | Freq: Every day | ORAL | 11 refills | Status: DC

## 2021-08-17 MED ORDER — LEVOCETIRIZINE DIHYDROCHLORIDE 5 MG PO TABS: 5.0000 mg | ORAL_TABLET | Freq: Two times a day (BID) | ORAL | 11 refills | Status: DC | PRN

## 2021-08-17 MED ORDER — FLUTICASONE PROPIONATE 50 MCG/ACT NA SUSP: 1.0000 | Freq: Two times a day (BID) | NASAL | 11 refills | Status: DC

## 2021-08-17 MED ORDER — EPINEPHRINE 0.3 MG/0.3ML IJ SOAJ: 0.3000 mg | Freq: Once | INTRAMUSCULAR | 1 refills | Status: DC

## 2021-08-17 NOTE — Telephone Encounter (Signed)
Patient called and expressed that when he went to [facility] in [place] he was given the run around and he would like to switch his medications to the [place] [facility]. I have sent the medications over the the [place] [facility] in [place]. Patient stated that he would get them next week and he'd call with any issues.

## 2021-08-17 NOTE — Progress Notes (Signed)
FOLLOW UP  Date of Service/Encounter:  08/17/21   Assessment:   Perennial and seasonal allergic rhinitis (grass pollen, ragweed pollen, weed pollen, tree pollen, mold, dust mites, cats) - interested in starting allergen immunotherapy   Plan/Recommendations:   1. Seasonal and perennial allergic rhinitis (grass pollen, ragweed pollen, weed pollen, tree pollen, mold, dust mites, cats) - Continue with Flonase one spay per nostril twice daily.  -Continue with Astelin one spray per nostril twice daily. - Continue with Singulair 10mg  daily. - Continue with Xyzal 5 mg 1-2 times daily. [facility].  - We will hold off Dupixent for now.  - Allergy shot signed today (make an appointment to start these in 2-3 weeks) - Allergy shots "re-train" and "reset" the immune system to ignore environmental allergens and decrease the resulting immune response to those allergens (sneezing, itchy watery eyes, runny nose, nasal congestion, etc).    - Allergy shots improve symptoms in 75-85% of patients.   2. Return in about 6 months (around 02/15/2022).    Subjective:   Willie Ortega is a 65 y.o. male presenting today for follow up of  Chief Complaint  Patient presents with   Follow-up   Medication Refill    Eward Rutigliano Barish has a history of the following: Patient Active Problem List   Diagnosis Date Noted   Other allergic rhinitis 12/12/2020   Allergic conjunctivitis of both eyes 12/12/2020    History obtained from: chart review and patient.  Willie Ortega is a 65 y.o. male presenting for a follow up visit.  He was last seen in July 2022.  At that time, we continue with his Flonase as well as Astelin, Singulair, and Xyzal.  He had very enlarged turbinates on exam.  He did report that he had a recent sinus CT which we obtained from the [facility] clinic. He had a sinus CT in August 2022 that demonstrated a small polyp versus retention cyst in the  right maxillary sinus.  There is discussion about starting Dupixent.  Since last visit, he has mostly done well. He has had some rhinorrhea intermittently. Weather changes cause the issues. He is not interested in injections. He has never seen ENT for the nasal polyps. He is not interested in doing much of anything about it.   He does not get sinus infections. He is using his nose sprays. They do seem to help. Season changes are typically the worst. He had some issues with the rain all week. He does report that he can get ear aches and it becomes sensitive and painful to a certain extent. It seems that pain runs through it from time to time.   He does have a rash on his right foot. He treats with anti itching cream. He denies infection or discharge or anything like that. He has never had antibiotics or prednisone for this.   He is thinking that he needs to move to [place]. He has never been there.   He recently developed an enlarged left lymph node in his shoulder around two months ago within a day or two of getting his COVID booster. He did have an ultrasound done that was negative for clots. He is going to have an MRI next week. No biopsy is scheduled at this time. He did take antibiotics for it and it did not resolve.    He was exposed to silica dust and other contaminants. He denies coughing or  wheezing or respiratory symptoms at all. 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   Otherwise, there have been no changes to his past medical history, surgical history, family history, or social history.    Review of Systems  Constitutional: Negative.  Negative for chills, fever, malaise/fatigue and weight loss.  HENT:  Positive for congestion. Negative for ear discharge, ear pain and sinus pain.        Positive for postnasal drip.  Eyes:  Negative for pain, discharge and redness.  Respiratory:  Negative for cough, sputum production, shortness of breath and  wheezing.   Cardiovascular: Negative.  Negative for chest pain and palpitations.  Gastrointestinal:  Negative for abdominal pain, constipation, diarrhea, heartburn, nausea and vomiting.  Skin: Negative.  Negative for itching and rash.  Neurological:  Negative for dizziness and headaches.  Endo/Heme/Allergies:  Positive for environmental allergies. Does not bruise/bleed easily.      Objective:   Blood pressure 108/74, pulse 83, temperature (!) 97.3 F (36.3 C), temperature source Temporal, resp. rate 18, SpO2 94 %. There is no height or weight on file to calculate BMI.   Physical Exam:  Physical Exam Vitals reviewed.  Constitutional:      Appearance: He is well-developed. He is obese.  HENT:     Head: Normocephalic and atraumatic.     Right Ear: Tympanic membrane, ear canal and external ear normal.     Left Ear: Tympanic membrane, ear canal and external ear normal.     Nose: Mucosal edema and rhinorrhea present. No nasal deformity or septal deviation.     Right Turbinates: Enlarged, swollen and pale.     Left Turbinates: Enlarged, swollen and pale.     Right Sinus: No maxillary sinus tenderness or frontal sinus tenderness.     Left Sinus: No maxillary sinus tenderness or frontal sinus tenderness.     Comments: No nasal polyps, although I cannot see past the turbinates very well.    Mouth/Throat:     Mouth: Mucous membranes are not pale and not dry.     Pharynx: Uvula midline.     Comments: Cobblestoning in the posterior oropharynx.  Eyes:     General:        Right eye: No discharge.        Left eye: No discharge.     Conjunctiva/sclera: Conjunctivae normal.     Right eye: Right conjunctiva is not injected. No chemosis.    Left eye: Left conjunctiva is not injected. No chemosis.    Pupils: Pupils are equal, round, and reactive to light.  Cardiovascular:     Rate and Rhythm: Normal rate and regular rhythm.     Heart sounds: Normal heart sounds.  Pulmonary:     Effort:  Pulmonary effort is normal. No tachypnea, accessory muscle usage or respiratory distress.     Breath sounds: Normal breath sounds. No wheezing, rhonchi or rales.     Comments: Moving air well in all lung fields. Chest:     Chest wall: No tenderness.  Lymphadenopathy:     Cervical: No cervical adenopathy.  Skin:    Coloration: Skin is not pale.     Findings: No abrasion, erythema, petechiae or rash. Rash is not papular, urticarial or vesicular.  Neurological:     Mental Status: He is alert.  Psychiatric:        Behavior: Behavior is cooperative.     Diagnostic studies: none     Malachi Bonds, MD  [facility]

## 2021-08-17 NOTE — Telephone Encounter (Signed)
Did you resend the scripts to the [place] or should I do that?   Malachi Bonds, MD [facility]

## 2021-08-17 NOTE — Patient Instructions (Addendum)
1. Seasonal and perennial allergic rhinitis (grass pollen, ragweed pollen, weed pollen, tree pollen, mold, dust mites, cats) - Continue with Flonase one spay per nostril twice daily.  -Continue with Astelin one spray per nostril twice daily. - Continue with Singulair 10mg  daily. - Continue with Xyzal 5 mg 1-2 times daily. - [facility].  - We will hold off Dupixent for now.  - Allergy shot signed today (make an appointment to start these in 2-3 weeks) - Allergy shots "re-train" and "reset" the immune system to ignore environmental allergens and decrease the resulting immune response to those allergens (sneezing, itchy watery eyes, runny nose, nasal congestion, etc).    - Allergy shots improve symptoms in 75-85% of patients.   2. Return in about 6 months (around 02/15/2022).    Please inform 04/17/2022 of any Emergency Department visits, hospitalizations, or changes in symptoms. Call us before going to the ED for breathing or allergy symptoms since we might be able to fit you in for a sick visit. Feel free to contact us anytime with any questions, problems, or concerns.  It was a pleasure to meet you today!  Websites that have reliable patient information: 1. American Academy of Asthma, Allergy, and Immunology: www.aaaai.org 2. Food Allergy Research and Education (FARE): foodallergy.org 3. Mothers of Asthmatics: http://www.asthmacommunitynetwork.org 4. American College of Allergy, Asthma, and Immunology: www.acaai.org   COVID-19 Vaccine Information can be found at: [place] For questions related to vaccine distribution or appointments, please email [email or web address] or call [phone].   We realize that you might be concerned about having an allergic reaction to the COVID19 vaccines. To help with that concern, WE ARE OFFERING THE COVID19 VACCINES IN OUR OFFICE! Ask the front desk  for dates!     "Like" [phone] on Facebook and Instagram for our latest updates!      A healthy democracy works best when [place] participate! Make sure you are registered to vote! If you have moved or changed any of your contact information, you will need to get this updated before voting!  In some cases, you MAY be able to register to vote online: [employer]

## 2021-08-20 DIAGNOSIS — J3081 Allergic rhinitis due to animal (cat) (dog) hair and dander: Secondary | ICD-10-CM | POA: Diagnosis not present

## 2021-08-20 NOTE — Progress Notes (Signed)
VIALS MADE. EXP 08-20-22 

## 2021-08-20 NOTE — Progress Notes (Signed)
Aeroallergen Immunotherapy   Ordering Provider: Dr. Malachi Bonds   Patient Details  Name: Willie Ortega  MRN: [id number]  Date of Birth: 06-06-1956   Order 2 of 2   Vial Label: RW/Molds/DM   0.3 ml (Volume)  1:20 Concentration -- Ragweed Mix  0.2 ml (Volume)  1:20 Concentration -- Alternaria alternata  0.2 ml (Volume)  1:20 Concentration -- Cladosporium herbarum  0.2 ml (Volume)  1:20 Concentration -- Bipolaris sorokiniana  0.2 ml (Volume)  1:20 Concentration -- Drechslera spicifera  0.2 ml (Volume)  1:10 Concentration -- Mucor plumbeus  0.2 ml (Volume)  1:40 Concentration -- Epicoccum nigrum  0.2 ml (Volume)  1:40 Concentration -- Phoma betae  0.5 ml (Volume)   AU Concentration -- Mite Mix (DF 5,000 & DP 5,000)    2.2  ml Extract Subtotal  2.8  ml Diluent  5.0  ml Maintenance Total   Schedule:  A   Blue Vial (1:100,000): Schedule A (10 doses)  Yellow Vial (1:10,000): Schedule A (10 doses)  Green Vial (1:1,000): Schedule A (10 doses)  Red Vial (1:100): Schedule A (10 doses)   Special Instructions: none

## 2021-08-20 NOTE — Progress Notes (Signed)
Aeroallergen Immunotherapy   Ordering Provider: Dr. Malachi Bonds   Patient Details  Name: ENZO TREU  MRN: [id number]  Date of Birth: 1956-04-10   Order 1 of 2   Vial Label: G/W/T/C   0.3 ml (Volume)  BAU Concentration -- 7 Grass Mix* 100,000 ([address] [place], [place], [place], [place], [place], Sweet Vernal, [place])  0.2 ml (Volume)  1:20 Concentration -- [place]  0.5 ml (Volume)  1:20 Concentration -- Weed Mix*  0.5 ml (Volume)  1:20 Concentration -- Eastern 10 Tree Mix (also Sweet Gum)  0.2 ml (Volume)  1:20 Concentration -- Box Elder  0.1 ml (Volume)  1:20 Concentration -- Maple Mix*  0.1 ml (Volume)  1:10 Concentration -- Oak, [place] mix*  0.5 ml (Volume)  1:10 Concentration -- Cat Hair    2.4  ml Extract Subtotal  2.6  ml Diluent  5.0  ml Maintenance Total   Schedule:  A   Blue Vial (1:100,000): Schedule A (10 doses)  Yellow Vial (1:10,000): Schedule A (10 doses)  Green Vial (1:1,000): Schedule A (10 doses)  Red Vial (1:100): Schedule A (10 doses)   Special Instructions: none

## 2021-08-21 DIAGNOSIS — J3089 Other allergic rhinitis: Secondary | ICD-10-CM | POA: Diagnosis not present

## 2021-09-12 ENCOUNTER — Ambulatory Visit: Payer: No Typology Code available for payment source

## 2021-09-14 ENCOUNTER — Ambulatory Visit (INDEPENDENT_AMBULATORY_CARE_PROVIDER_SITE_OTHER): Payer: No Typology Code available for payment source

## 2021-09-14 ENCOUNTER — Other Ambulatory Visit: Payer: Self-pay

## 2021-09-14 ENCOUNTER — Ambulatory Visit: Payer: No Typology Code available for payment source

## 2021-09-14 DIAGNOSIS — J309 Allergic rhinitis, unspecified: Secondary | ICD-10-CM

## 2021-09-14 NOTE — Progress Notes (Signed)
Immunotherapy   Patient Details  Name: Willie Ortega MRN: [id number] Date of Birth: August 17, 1956  09/14/2021  Willie Ortega started injections for  allergies on the blue vial, patient waited 30 minutes in the office without any reactions. Following schedule: A  Frequency:1 time per week Epi-Pen:Epi-Pen Available   Consent signed and patient instructions given.   Florence Canner 09/14/2021, 2:40 PM

## 2021-09-21 ENCOUNTER — Ambulatory Visit (INDEPENDENT_AMBULATORY_CARE_PROVIDER_SITE_OTHER): Payer: No Typology Code available for payment source

## 2021-09-21 DIAGNOSIS — J309 Allergic rhinitis, unspecified: Secondary | ICD-10-CM | POA: Diagnosis not present

## 2021-09-28 ENCOUNTER — Ambulatory Visit (INDEPENDENT_AMBULATORY_CARE_PROVIDER_SITE_OTHER): Payer: No Typology Code available for payment source

## 2021-09-28 DIAGNOSIS — J309 Allergic rhinitis, unspecified: Secondary | ICD-10-CM

## 2021-10-05 ENCOUNTER — Ambulatory Visit (INDEPENDENT_AMBULATORY_CARE_PROVIDER_SITE_OTHER): Payer: No Typology Code available for payment source

## 2021-10-05 DIAGNOSIS — J309 Allergic rhinitis, unspecified: Secondary | ICD-10-CM | POA: Diagnosis not present

## 2021-10-10 ENCOUNTER — Ambulatory Visit (INDEPENDENT_AMBULATORY_CARE_PROVIDER_SITE_OTHER): Payer: No Typology Code available for payment source

## 2021-10-10 DIAGNOSIS — J309 Allergic rhinitis, unspecified: Secondary | ICD-10-CM

## 2021-10-17 ENCOUNTER — Ambulatory Visit (INDEPENDENT_AMBULATORY_CARE_PROVIDER_SITE_OTHER): Payer: No Typology Code available for payment source

## 2021-10-17 DIAGNOSIS — J309 Allergic rhinitis, unspecified: Secondary | ICD-10-CM

## 2021-10-24 ENCOUNTER — Ambulatory Visit (INDEPENDENT_AMBULATORY_CARE_PROVIDER_SITE_OTHER): Payer: No Typology Code available for payment source | Admitting: *Deleted

## 2021-10-24 DIAGNOSIS — J309 Allergic rhinitis, unspecified: Secondary | ICD-10-CM | POA: Diagnosis not present

## 2021-11-07 ENCOUNTER — Ambulatory Visit (INDEPENDENT_AMBULATORY_CARE_PROVIDER_SITE_OTHER): Payer: No Typology Code available for payment source

## 2021-11-07 DIAGNOSIS — J309 Allergic rhinitis, unspecified: Secondary | ICD-10-CM

## 2021-11-14 ENCOUNTER — Ambulatory Visit (INDEPENDENT_AMBULATORY_CARE_PROVIDER_SITE_OTHER): Payer: No Typology Code available for payment source

## 2021-11-14 DIAGNOSIS — J309 Allergic rhinitis, unspecified: Secondary | ICD-10-CM

## 2021-11-21 ENCOUNTER — Ambulatory Visit (INDEPENDENT_AMBULATORY_CARE_PROVIDER_SITE_OTHER): Payer: No Typology Code available for payment source | Admitting: *Deleted

## 2021-11-21 DIAGNOSIS — J309 Allergic rhinitis, unspecified: Secondary | ICD-10-CM

## 2021-11-29 ENCOUNTER — Telehealth: Payer: Self-pay | Admitting: Allergy & Immunology

## 2021-11-29 NOTE — Telephone Encounter (Signed)
Faxed renewal of authorization request to [facility], [phone] and emailed it to "[facility] CC Medical Records RFAS" [email or web address]> ?

## 2021-11-30 ENCOUNTER — Ambulatory Visit (INDEPENDENT_AMBULATORY_CARE_PROVIDER_SITE_OTHER): Payer: No Typology Code available for payment source

## 2021-11-30 DIAGNOSIS — J309 Allergic rhinitis, unspecified: Secondary | ICD-10-CM | POA: Diagnosis not present

## 2021-12-05 ENCOUNTER — Ambulatory Visit (INDEPENDENT_AMBULATORY_CARE_PROVIDER_SITE_OTHER): Payer: No Typology Code available for payment source

## 2021-12-05 DIAGNOSIS — J309 Allergic rhinitis, unspecified: Secondary | ICD-10-CM

## 2021-12-12 ENCOUNTER — Ambulatory Visit (INDEPENDENT_AMBULATORY_CARE_PROVIDER_SITE_OTHER): Payer: No Typology Code available for payment source

## 2021-12-12 DIAGNOSIS — J309 Allergic rhinitis, unspecified: Secondary | ICD-10-CM

## 2021-12-19 ENCOUNTER — Ambulatory Visit (INDEPENDENT_AMBULATORY_CARE_PROVIDER_SITE_OTHER): Payer: No Typology Code available for payment source

## 2021-12-19 DIAGNOSIS — J309 Allergic rhinitis, unspecified: Secondary | ICD-10-CM | POA: Diagnosis not present

## 2021-12-19 NOTE — Telephone Encounter (Signed)
Received updated authorization, [id number] ? ?I have updated it in our system.  ?

## 2021-12-26 ENCOUNTER — Ambulatory Visit (INDEPENDENT_AMBULATORY_CARE_PROVIDER_SITE_OTHER): Payer: No Typology Code available for payment source

## 2021-12-26 DIAGNOSIS — J309 Allergic rhinitis, unspecified: Secondary | ICD-10-CM | POA: Diagnosis not present

## 2022-01-02 ENCOUNTER — Ambulatory Visit (INDEPENDENT_AMBULATORY_CARE_PROVIDER_SITE_OTHER): Payer: No Typology Code available for payment source

## 2022-01-02 DIAGNOSIS — J309 Allergic rhinitis, unspecified: Secondary | ICD-10-CM

## 2022-01-09 ENCOUNTER — Ambulatory Visit (INDEPENDENT_AMBULATORY_CARE_PROVIDER_SITE_OTHER): Payer: No Typology Code available for payment source

## 2022-01-09 DIAGNOSIS — J309 Allergic rhinitis, unspecified: Secondary | ICD-10-CM

## 2022-01-16 ENCOUNTER — Ambulatory Visit (INDEPENDENT_AMBULATORY_CARE_PROVIDER_SITE_OTHER): Payer: No Typology Code available for payment source

## 2022-01-16 DIAGNOSIS — J309 Allergic rhinitis, unspecified: Secondary | ICD-10-CM | POA: Diagnosis not present

## 2022-01-23 ENCOUNTER — Ambulatory Visit (INDEPENDENT_AMBULATORY_CARE_PROVIDER_SITE_OTHER): Payer: No Typology Code available for payment source

## 2022-01-23 DIAGNOSIS — J309 Allergic rhinitis, unspecified: Secondary | ICD-10-CM

## 2022-01-30 ENCOUNTER — Ambulatory Visit (INDEPENDENT_AMBULATORY_CARE_PROVIDER_SITE_OTHER): Payer: No Typology Code available for payment source

## 2022-01-30 DIAGNOSIS — J309 Allergic rhinitis, unspecified: Secondary | ICD-10-CM

## 2022-02-06 ENCOUNTER — Ambulatory Visit (INDEPENDENT_AMBULATORY_CARE_PROVIDER_SITE_OTHER): Payer: No Typology Code available for payment source

## 2022-02-06 DIAGNOSIS — J309 Allergic rhinitis, unspecified: Secondary | ICD-10-CM

## 2022-02-13 ENCOUNTER — Ambulatory Visit (INDEPENDENT_AMBULATORY_CARE_PROVIDER_SITE_OTHER): Payer: No Typology Code available for payment source

## 2022-02-13 DIAGNOSIS — J309 Allergic rhinitis, unspecified: Secondary | ICD-10-CM | POA: Diagnosis not present

## 2022-02-22 ENCOUNTER — Ambulatory Visit (INDEPENDENT_AMBULATORY_CARE_PROVIDER_SITE_OTHER): Payer: No Typology Code available for payment source

## 2022-02-22 DIAGNOSIS — J309 Allergic rhinitis, unspecified: Secondary | ICD-10-CM | POA: Diagnosis not present

## 2022-02-27 ENCOUNTER — Ambulatory Visit (INDEPENDENT_AMBULATORY_CARE_PROVIDER_SITE_OTHER): Payer: No Typology Code available for payment source

## 2022-02-27 DIAGNOSIS — J309 Allergic rhinitis, unspecified: Secondary | ICD-10-CM

## 2022-03-06 ENCOUNTER — Ambulatory Visit (INDEPENDENT_AMBULATORY_CARE_PROVIDER_SITE_OTHER): Payer: No Typology Code available for payment source

## 2022-03-06 DIAGNOSIS — J309 Allergic rhinitis, unspecified: Secondary | ICD-10-CM | POA: Diagnosis not present

## 2022-03-13 ENCOUNTER — Ambulatory Visit (INDEPENDENT_AMBULATORY_CARE_PROVIDER_SITE_OTHER): Payer: No Typology Code available for payment source

## 2022-03-13 DIAGNOSIS — J309 Allergic rhinitis, unspecified: Secondary | ICD-10-CM | POA: Diagnosis not present

## 2022-03-20 ENCOUNTER — Ambulatory Visit (INDEPENDENT_AMBULATORY_CARE_PROVIDER_SITE_OTHER): Payer: No Typology Code available for payment source

## 2022-03-20 DIAGNOSIS — J309 Allergic rhinitis, unspecified: Secondary | ICD-10-CM | POA: Diagnosis not present

## 2022-03-27 ENCOUNTER — Ambulatory Visit (INDEPENDENT_AMBULATORY_CARE_PROVIDER_SITE_OTHER): Payer: No Typology Code available for payment source

## 2022-03-27 DIAGNOSIS — J309 Allergic rhinitis, unspecified: Secondary | ICD-10-CM

## 2022-04-03 ENCOUNTER — Ambulatory Visit (INDEPENDENT_AMBULATORY_CARE_PROVIDER_SITE_OTHER): Payer: No Typology Code available for payment source

## 2022-04-03 DIAGNOSIS — J309 Allergic rhinitis, unspecified: Secondary | ICD-10-CM

## 2022-04-10 ENCOUNTER — Ambulatory Visit (INDEPENDENT_AMBULATORY_CARE_PROVIDER_SITE_OTHER): Payer: No Typology Code available for payment source

## 2022-04-10 DIAGNOSIS — J309 Allergic rhinitis, unspecified: Secondary | ICD-10-CM

## 2022-04-17 ENCOUNTER — Ambulatory Visit (INDEPENDENT_AMBULATORY_CARE_PROVIDER_SITE_OTHER): Payer: No Typology Code available for payment source

## 2022-04-17 DIAGNOSIS — J309 Allergic rhinitis, unspecified: Secondary | ICD-10-CM | POA: Diagnosis not present

## 2022-04-24 ENCOUNTER — Ambulatory Visit (INDEPENDENT_AMBULATORY_CARE_PROVIDER_SITE_OTHER): Payer: No Typology Code available for payment source

## 2022-04-24 DIAGNOSIS — J309 Allergic rhinitis, unspecified: Secondary | ICD-10-CM

## 2022-05-01 ENCOUNTER — Ambulatory Visit (INDEPENDENT_AMBULATORY_CARE_PROVIDER_SITE_OTHER): Payer: No Typology Code available for payment source

## 2022-05-01 DIAGNOSIS — J309 Allergic rhinitis, unspecified: Secondary | ICD-10-CM | POA: Diagnosis not present

## 2022-05-08 ENCOUNTER — Ambulatory Visit (INDEPENDENT_AMBULATORY_CARE_PROVIDER_SITE_OTHER): Payer: No Typology Code available for payment source

## 2022-05-08 DIAGNOSIS — J309 Allergic rhinitis, unspecified: Secondary | ICD-10-CM | POA: Diagnosis not present

## 2022-05-15 ENCOUNTER — Ambulatory Visit (INDEPENDENT_AMBULATORY_CARE_PROVIDER_SITE_OTHER): Payer: No Typology Code available for payment source

## 2022-05-15 DIAGNOSIS — J309 Allergic rhinitis, unspecified: Secondary | ICD-10-CM | POA: Diagnosis not present

## 2022-05-22 ENCOUNTER — Ambulatory Visit (INDEPENDENT_AMBULATORY_CARE_PROVIDER_SITE_OTHER): Payer: No Typology Code available for payment source

## 2022-05-22 DIAGNOSIS — J309 Allergic rhinitis, unspecified: Secondary | ICD-10-CM

## 2022-05-29 ENCOUNTER — Ambulatory Visit (INDEPENDENT_AMBULATORY_CARE_PROVIDER_SITE_OTHER): Payer: No Typology Code available for payment source

## 2022-05-29 DIAGNOSIS — J309 Allergic rhinitis, unspecified: Secondary | ICD-10-CM | POA: Diagnosis not present

## 2022-06-05 ENCOUNTER — Ambulatory Visit (INDEPENDENT_AMBULATORY_CARE_PROVIDER_SITE_OTHER): Payer: No Typology Code available for payment source

## 2022-06-05 DIAGNOSIS — J309 Allergic rhinitis, unspecified: Secondary | ICD-10-CM | POA: Diagnosis not present

## 2022-06-12 ENCOUNTER — Ambulatory Visit (INDEPENDENT_AMBULATORY_CARE_PROVIDER_SITE_OTHER): Payer: No Typology Code available for payment source

## 2022-06-12 DIAGNOSIS — J309 Allergic rhinitis, unspecified: Secondary | ICD-10-CM | POA: Diagnosis not present

## 2022-06-19 ENCOUNTER — Ambulatory Visit (INDEPENDENT_AMBULATORY_CARE_PROVIDER_SITE_OTHER): Payer: No Typology Code available for payment source | Admitting: *Deleted

## 2022-06-19 DIAGNOSIS — J309 Allergic rhinitis, unspecified: Secondary | ICD-10-CM

## 2022-06-26 ENCOUNTER — Ambulatory Visit (INDEPENDENT_AMBULATORY_CARE_PROVIDER_SITE_OTHER): Payer: No Typology Code available for payment source

## 2022-06-26 DIAGNOSIS — J309 Allergic rhinitis, unspecified: Secondary | ICD-10-CM | POA: Diagnosis not present

## 2022-06-27 DIAGNOSIS — J3081 Allergic rhinitis due to animal (cat) (dog) hair and dander: Secondary | ICD-10-CM | POA: Diagnosis not present

## 2022-06-27 NOTE — Progress Notes (Signed)
VIALS EXP 06-28-23 

## 2022-06-28 DIAGNOSIS — J3089 Other allergic rhinitis: Secondary | ICD-10-CM | POA: Diagnosis not present

## 2022-07-03 ENCOUNTER — Ambulatory Visit (INDEPENDENT_AMBULATORY_CARE_PROVIDER_SITE_OTHER): Payer: No Typology Code available for payment source

## 2022-07-03 DIAGNOSIS — J309 Allergic rhinitis, unspecified: Secondary | ICD-10-CM

## 2022-07-10 ENCOUNTER — Ambulatory Visit (INDEPENDENT_AMBULATORY_CARE_PROVIDER_SITE_OTHER): Payer: No Typology Code available for payment source | Admitting: *Deleted

## 2022-07-10 DIAGNOSIS — J309 Allergic rhinitis, unspecified: Secondary | ICD-10-CM

## 2022-07-17 ENCOUNTER — Ambulatory Visit (INDEPENDENT_AMBULATORY_CARE_PROVIDER_SITE_OTHER): Payer: No Typology Code available for payment source

## 2022-07-17 DIAGNOSIS — J309 Allergic rhinitis, unspecified: Secondary | ICD-10-CM

## 2022-07-24 ENCOUNTER — Ambulatory Visit (INDEPENDENT_AMBULATORY_CARE_PROVIDER_SITE_OTHER): Payer: No Typology Code available for payment source

## 2022-07-24 DIAGNOSIS — J309 Allergic rhinitis, unspecified: Secondary | ICD-10-CM

## 2022-07-31 ENCOUNTER — Ambulatory Visit (INDEPENDENT_AMBULATORY_CARE_PROVIDER_SITE_OTHER): Payer: No Typology Code available for payment source

## 2022-07-31 DIAGNOSIS — J309 Allergic rhinitis, unspecified: Secondary | ICD-10-CM

## 2022-08-09 ENCOUNTER — Ambulatory Visit (INDEPENDENT_AMBULATORY_CARE_PROVIDER_SITE_OTHER): Payer: No Typology Code available for payment source

## 2022-08-09 DIAGNOSIS — J309 Allergic rhinitis, unspecified: Secondary | ICD-10-CM

## 2022-08-14 ENCOUNTER — Ambulatory Visit (INDEPENDENT_AMBULATORY_CARE_PROVIDER_SITE_OTHER): Payer: No Typology Code available for payment source

## 2022-08-14 DIAGNOSIS — J309 Allergic rhinitis, unspecified: Secondary | ICD-10-CM

## 2022-08-21 ENCOUNTER — Ambulatory Visit (INDEPENDENT_AMBULATORY_CARE_PROVIDER_SITE_OTHER): Payer: No Typology Code available for payment source

## 2022-08-21 DIAGNOSIS — J309 Allergic rhinitis, unspecified: Secondary | ICD-10-CM | POA: Diagnosis not present

## 2022-09-04 ENCOUNTER — Ambulatory Visit (INDEPENDENT_AMBULATORY_CARE_PROVIDER_SITE_OTHER): Payer: No Typology Code available for payment source

## 2022-09-04 DIAGNOSIS — J309 Allergic rhinitis, unspecified: Secondary | ICD-10-CM

## 2022-09-11 ENCOUNTER — Ambulatory Visit (INDEPENDENT_AMBULATORY_CARE_PROVIDER_SITE_OTHER): Payer: No Typology Code available for payment source

## 2022-09-11 DIAGNOSIS — J309 Allergic rhinitis, unspecified: Secondary | ICD-10-CM | POA: Diagnosis not present

## 2022-09-20 ENCOUNTER — Ambulatory Visit (INDEPENDENT_AMBULATORY_CARE_PROVIDER_SITE_OTHER): Payer: No Typology Code available for payment source

## 2022-09-20 DIAGNOSIS — J309 Allergic rhinitis, unspecified: Secondary | ICD-10-CM

## 2022-09-25 ENCOUNTER — Ambulatory Visit (INDEPENDENT_AMBULATORY_CARE_PROVIDER_SITE_OTHER): Payer: No Typology Code available for payment source

## 2022-09-25 DIAGNOSIS — J309 Allergic rhinitis, unspecified: Secondary | ICD-10-CM

## 2022-10-02 ENCOUNTER — Ambulatory Visit (INDEPENDENT_AMBULATORY_CARE_PROVIDER_SITE_OTHER): Payer: No Typology Code available for payment source

## 2022-10-02 DIAGNOSIS — J309 Allergic rhinitis, unspecified: Secondary | ICD-10-CM | POA: Diagnosis not present

## 2022-10-07 DIAGNOSIS — J3081 Allergic rhinitis due to animal (cat) (dog) hair and dander: Secondary | ICD-10-CM

## 2022-10-08 DIAGNOSIS — J3089 Other allergic rhinitis: Secondary | ICD-10-CM

## 2022-10-08 NOTE — Progress Notes (Signed)
VIALS EXP 10-09-23 

## 2022-10-09 ENCOUNTER — Ambulatory Visit (INDEPENDENT_AMBULATORY_CARE_PROVIDER_SITE_OTHER): Payer: No Typology Code available for payment source

## 2022-10-09 DIAGNOSIS — J309 Allergic rhinitis, unspecified: Secondary | ICD-10-CM

## 2022-10-16 ENCOUNTER — Ambulatory Visit (INDEPENDENT_AMBULATORY_CARE_PROVIDER_SITE_OTHER): Payer: No Typology Code available for payment source

## 2022-10-16 DIAGNOSIS — J309 Allergic rhinitis, unspecified: Secondary | ICD-10-CM

## 2022-10-25 ENCOUNTER — Ambulatory Visit (INDEPENDENT_AMBULATORY_CARE_PROVIDER_SITE_OTHER): Payer: No Typology Code available for payment source

## 2022-10-25 DIAGNOSIS — J309 Allergic rhinitis, unspecified: Secondary | ICD-10-CM

## 2022-10-28 ENCOUNTER — Other Ambulatory Visit: Payer: Self-pay

## 2022-10-28 ENCOUNTER — Encounter: Payer: Self-pay | Admitting: Internal Medicine

## 2022-10-28 ENCOUNTER — Ambulatory Visit (INDEPENDENT_AMBULATORY_CARE_PROVIDER_SITE_OTHER): Payer: No Typology Code available for payment source | Admitting: Internal Medicine

## 2022-10-28 VITALS — BP 110/68 | HR 83 | Temp 98.7°F | Resp 16 | Ht 75.0 in | Wt 287.2 lb

## 2022-10-28 DIAGNOSIS — J3089 Other allergic rhinitis: Secondary | ICD-10-CM

## 2022-10-28 DIAGNOSIS — J309 Allergic rhinitis, unspecified: Secondary | ICD-10-CM | POA: Diagnosis not present

## 2022-10-28 DIAGNOSIS — J302 Other seasonal allergic rhinitis: Secondary | ICD-10-CM

## 2022-10-28 MED ORDER — AZELASTINE HCL 0.1 % NA SOLN: 1.0000 | Freq: Two times a day (BID) | NASAL | 11 refills | Status: DC | PRN

## 2022-10-28 MED ORDER — MONTELUKAST SODIUM 10 MG PO TABS: 10.0000 mg | ORAL_TABLET | Freq: Every day | ORAL | 11 refills | Status: DC

## 2022-10-28 MED ORDER — LEVOCETIRIZINE DIHYDROCHLORIDE 5 MG PO TABS: 5.0000 mg | ORAL_TABLET | Freq: Two times a day (BID) | ORAL | 11 refills | Status: DC | PRN

## 2022-10-28 MED ORDER — EPINEPHRINE 0.3 MG/0.3ML IJ SOAJ: 0.3000 mg | INTRAMUSCULAR | 1 refills | Status: AC | PRN

## 2022-10-28 MED ORDER — FLUTICASONE PROPIONATE 50 MCG/ACT NA SUSP: 1.0000 | Freq: Two times a day (BID) | NASAL | 11 refills | Status: DC

## 2022-10-28 NOTE — Progress Notes (Signed)
   FOLLOW UP Date of Service/Encounter:  10/28/22   Subjective:  Willie Ortega (DOB: 09/23/1955) is a 67 y.o. male who returns to the [facility] on 10/28/2022 for follow up for allergic rhinitis on AIT  History obtained from: chart review and patient. Last visit was with Dr. Ernst Bowler and due to uncontrolled symptoms was started on AIT 08/17/2021.  Of note, he had a CT sinus at [facility] Aug 2022 with possible small polyp vs retention cyst in R maxillary sinus.  He is not interested in seeing ENT or undergoing surgery for removal.   Since last visit, he reports doing better.  He doesn't feel as nasally as prior. He still does have some stuffiness and runny nose.  Using Flonase and Azelastine 1 SEN BID and Xyzal and Singulair daily.   Not much ocular symptoms or sneezing.  No issues with allergy shots.  He comes regularly. Has an Epipen.   Past Medical History: Past Medical History:  Diagnosis Date   Anxiety    Back pain    Sleep apnea     Objective:  BP 110/68   Pulse 83   Temp 98.7 F (37.1 C) (Temporal)   Resp 16   Ht 6' 3"$  (1.905 m)   Wt 287 lb 3.2 oz (130.3 kg)   SpO2 98%   BMI 35.90 kg/m  Body mass index is 35.9 kg/m. Physical Exam: GEN: alert, well developed HEENT: clear conjunctiva, TM grey and translucent, nose with moderate inferior turbinate hypertrophy, pink nasal mucosa, clear rhinorrhea, no cobblestoning HEART: regular rate and rhythm, no murmur LUNGS: clear to auscultation bilaterally, no coughing, unlabored respiration SKIN: no rashes or lesions  Assessment:   1. Allergic rhinitis, unspecified seasonality, unspecified trigger   2. Seasonal and perennial allergic rhinitis     Plan/Recommendations:  1. Seasonal and perennial allergic rhinitis (grass pollen, ragweed pollen, weed pollen, tree pollen, mold, dust mites, cats) - Continue with Flonase one spay per nostril twice daily.  -Continue with Astelin one spray per nostril  twice daily. - Continue with Singulair 28m daily. - Continue with Xyzal 5 mg daily.  Okay to take an extra dose in the evening if needed.  - Continue allergy shot on schedule, started 09/2021, reached red vial in 04/2022.  Recommend at least 3-5 years after maintenance so around 04/2025-04/2027.  Always bring Epipen for shot visit.   - If symptoms don't improve, [place] a [place] of R maxillary retention cyst vs polyp. At this time, he is not interested in seeing ENT or undergoing surgery for removal.    Return in about 1 year (around 10/29/2023).  PHarlon Flor MD [facility]

## 2022-10-28 NOTE — Patient Instructions (Addendum)
1. Seasonal and perennial allergic rhinitis (grass pollen, ragweed pollen, weed pollen, tree pollen, mold, dust mites, cats) - Continue with Flonase one spay per nostril twice daily.  -Continue with Astelin one spray per nostril twice daily. - Continue with Singulair 8m daily. - Continue with Xyzal 5 mg daily.  Okay to take an extra dose in the evening if needed.  - Continue allergy shot on schedule, started 09/2021, reached red vial in 04/2022.  Recommend at least 3-5 years after maintenance so around 04/2025-04/2027.  Always bring Epipen for shot visit.

## 2022-11-06 ENCOUNTER — Ambulatory Visit (INDEPENDENT_AMBULATORY_CARE_PROVIDER_SITE_OTHER): Payer: No Typology Code available for payment source

## 2022-11-06 DIAGNOSIS — J309 Allergic rhinitis, unspecified: Secondary | ICD-10-CM

## 2022-11-13 ENCOUNTER — Ambulatory Visit (INDEPENDENT_AMBULATORY_CARE_PROVIDER_SITE_OTHER): Payer: No Typology Code available for payment source

## 2022-11-13 DIAGNOSIS — J309 Allergic rhinitis, unspecified: Secondary | ICD-10-CM | POA: Diagnosis not present

## 2022-11-19 ENCOUNTER — Telehealth: Payer: Self-pay | Admitting: Internal Medicine

## 2022-11-19 NOTE — Telephone Encounter (Signed)
Patient called back - advised patient of his expiration date & advised him to reach out to [place] as well. Patient verbalized understanding.

## 2022-11-19 NOTE — Telephone Encounter (Signed)
Faxed renewal of authorization request to [facility], [phone] and emailed it to [email or web address].   Called patient to advise him current authorization([id number])  expires on 12/12/2022 LVM for patient to call back to discuss

## 2022-11-20 ENCOUNTER — Ambulatory Visit (INDEPENDENT_AMBULATORY_CARE_PROVIDER_SITE_OTHER): Payer: No Typology Code available for payment source

## 2022-11-20 DIAGNOSIS — J309 Allergic rhinitis, unspecified: Secondary | ICD-10-CM | POA: Diagnosis not present

## 2022-11-27 ENCOUNTER — Ambulatory Visit (INDEPENDENT_AMBULATORY_CARE_PROVIDER_SITE_OTHER): Payer: No Typology Code available for payment source

## 2022-11-27 DIAGNOSIS — J309 Allergic rhinitis, unspecified: Secondary | ICD-10-CM

## 2022-12-11 ENCOUNTER — Ambulatory Visit (INDEPENDENT_AMBULATORY_CARE_PROVIDER_SITE_OTHER): Payer: No Typology Code available for payment source

## 2022-12-11 DIAGNOSIS — J309 Allergic rhinitis, unspecified: Secondary | ICD-10-CM

## 2022-12-25 ENCOUNTER — Ambulatory Visit (INDEPENDENT_AMBULATORY_CARE_PROVIDER_SITE_OTHER): Payer: No Typology Code available for payment source

## 2022-12-25 DIAGNOSIS — J309 Allergic rhinitis, unspecified: Secondary | ICD-10-CM

## 2022-12-31 DIAGNOSIS — J3081 Allergic rhinitis due to animal (cat) (dog) hair and dander: Secondary | ICD-10-CM | POA: Diagnosis not present

## 2022-12-31 NOTE — Progress Notes (Signed)
VIALS EXP 12-31-23 

## 2023-01-01 DIAGNOSIS — J3089 Other allergic rhinitis: Secondary | ICD-10-CM | POA: Diagnosis not present

## 2023-01-08 ENCOUNTER — Ambulatory Visit (INDEPENDENT_AMBULATORY_CARE_PROVIDER_SITE_OTHER): Payer: No Typology Code available for payment source

## 2023-01-08 DIAGNOSIS — J309 Allergic rhinitis, unspecified: Secondary | ICD-10-CM

## 2023-01-22 ENCOUNTER — Ambulatory Visit (INDEPENDENT_AMBULATORY_CARE_PROVIDER_SITE_OTHER): Payer: No Typology Code available for payment source

## 2023-01-22 DIAGNOSIS — J309 Allergic rhinitis, unspecified: Secondary | ICD-10-CM | POA: Diagnosis not present

## 2023-02-05 ENCOUNTER — Ambulatory Visit (INDEPENDENT_AMBULATORY_CARE_PROVIDER_SITE_OTHER): Payer: No Typology Code available for payment source

## 2023-02-05 DIAGNOSIS — J309 Allergic rhinitis, unspecified: Secondary | ICD-10-CM | POA: Diagnosis not present

## 2023-02-19 ENCOUNTER — Ambulatory Visit (INDEPENDENT_AMBULATORY_CARE_PROVIDER_SITE_OTHER): Payer: No Typology Code available for payment source

## 2023-02-19 DIAGNOSIS — J309 Allergic rhinitis, unspecified: Secondary | ICD-10-CM

## 2023-03-05 ENCOUNTER — Ambulatory Visit (INDEPENDENT_AMBULATORY_CARE_PROVIDER_SITE_OTHER): Payer: No Typology Code available for payment source

## 2023-03-05 DIAGNOSIS — J309 Allergic rhinitis, unspecified: Secondary | ICD-10-CM | POA: Diagnosis not present

## 2023-03-12 ENCOUNTER — Ambulatory Visit (INDEPENDENT_AMBULATORY_CARE_PROVIDER_SITE_OTHER): Payer: No Typology Code available for payment source

## 2023-03-12 DIAGNOSIS — J309 Allergic rhinitis, unspecified: Secondary | ICD-10-CM | POA: Diagnosis not present

## 2023-03-19 ENCOUNTER — Ambulatory Visit (INDEPENDENT_AMBULATORY_CARE_PROVIDER_SITE_OTHER): Payer: No Typology Code available for payment source

## 2023-03-19 DIAGNOSIS — J309 Allergic rhinitis, unspecified: Secondary | ICD-10-CM | POA: Diagnosis not present

## 2023-03-26 ENCOUNTER — Ambulatory Visit (INDEPENDENT_AMBULATORY_CARE_PROVIDER_SITE_OTHER): Payer: No Typology Code available for payment source

## 2023-03-26 DIAGNOSIS — J309 Allergic rhinitis, unspecified: Secondary | ICD-10-CM

## 2023-04-02 ENCOUNTER — Ambulatory Visit (INDEPENDENT_AMBULATORY_CARE_PROVIDER_SITE_OTHER): Payer: No Typology Code available for payment source

## 2023-04-02 DIAGNOSIS — J309 Allergic rhinitis, unspecified: Secondary | ICD-10-CM

## 2023-04-16 ENCOUNTER — Ambulatory Visit (INDEPENDENT_AMBULATORY_CARE_PROVIDER_SITE_OTHER): Payer: No Typology Code available for payment source

## 2023-04-16 DIAGNOSIS — J309 Allergic rhinitis, unspecified: Secondary | ICD-10-CM

## 2023-04-30 ENCOUNTER — Ambulatory Visit (INDEPENDENT_AMBULATORY_CARE_PROVIDER_SITE_OTHER): Payer: No Typology Code available for payment source

## 2023-04-30 DIAGNOSIS — J309 Allergic rhinitis, unspecified: Secondary | ICD-10-CM | POA: Diagnosis not present

## 2023-05-14 ENCOUNTER — Ambulatory Visit (INDEPENDENT_AMBULATORY_CARE_PROVIDER_SITE_OTHER): Payer: No Typology Code available for payment source

## 2023-05-14 DIAGNOSIS — J309 Allergic rhinitis, unspecified: Secondary | ICD-10-CM

## 2023-05-28 ENCOUNTER — Ambulatory Visit (INDEPENDENT_AMBULATORY_CARE_PROVIDER_SITE_OTHER): Payer: No Typology Code available for payment source

## 2023-05-28 DIAGNOSIS — J309 Allergic rhinitis, unspecified: Secondary | ICD-10-CM | POA: Diagnosis not present

## 2023-06-06 NOTE — Progress Notes (Signed)
EXP 06/08/24

## 2023-06-09 DIAGNOSIS — J3081 Allergic rhinitis due to animal (cat) (dog) hair and dander: Secondary | ICD-10-CM | POA: Diagnosis not present

## 2023-06-10 DIAGNOSIS — J3089 Other allergic rhinitis: Secondary | ICD-10-CM | POA: Diagnosis not present

## 2023-06-11 ENCOUNTER — Ambulatory Visit (INDEPENDENT_AMBULATORY_CARE_PROVIDER_SITE_OTHER): Payer: No Typology Code available for payment source

## 2023-06-11 DIAGNOSIS — J309 Allergic rhinitis, unspecified: Secondary | ICD-10-CM | POA: Diagnosis not present

## 2023-06-25 ENCOUNTER — Ambulatory Visit (INDEPENDENT_AMBULATORY_CARE_PROVIDER_SITE_OTHER): Payer: No Typology Code available for payment source

## 2023-06-25 DIAGNOSIS — J309 Allergic rhinitis, unspecified: Secondary | ICD-10-CM

## 2023-07-09 ENCOUNTER — Ambulatory Visit (INDEPENDENT_AMBULATORY_CARE_PROVIDER_SITE_OTHER): Payer: No Typology Code available for payment source

## 2023-07-09 DIAGNOSIS — J309 Allergic rhinitis, unspecified: Secondary | ICD-10-CM | POA: Diagnosis not present

## 2023-10-22 ENCOUNTER — Ambulatory Visit: Payer: No Typology Code available for payment source | Admitting: Allergy & Immunology

## 2023-11-17 ENCOUNTER — Telehealth: Payer: Self-pay | Admitting: Internal Medicine

## 2023-11-17 NOTE — Telephone Encounter (Signed)
 LVM to advise [facility] for a [facility] and informed that a new authorization request has been sent in.

## 2023-12-23 NOTE — Progress Notes (Unsigned)
 [address] [place] [address] Dept: [phone]  FOLLOW UP NOTE  Patient ID: ESHAWN COOR, male    DOB: Feb 20, 1956  Age: 68 y.o. MRN: [id number] Date of Office Visit: 12/24/2023  Assessment  Chief Complaint: Follow-up  HPI Willie Ortega is a 68 year old male who presents to the clinic for follow-up visit.  He was last seen in this clinic on 10/28/2022 by Dr. Allena Katz for evaluation of allergic rhinitis.  He began allergen immunotherapy directed toward ragweed, mold, dust mite, grass pollen, weed pollen, tree pollen, and cat on 08/27/2021.  His last injection was on 07/09/2023.  At today's visit, he reports his allergic rhinitis has been moderately well-controlled with only occasional nasal symptoms.  He continues montelukast, Flonase, azelastine, and Xyzal with relief of symptoms.  He reports that he is stopped taking allergen immunotherapy due to the amount of time related to getting injections as well as the risk of anaphylaxis.  He reports that he is not interested in restarting allergen immunotherapy at this time and he agrees to sign an allergen immunotherapy discontinuation form so that no new vials will be produced.  His current medications are listed in the chart.  Drug Allergies:  No Known Allergies  Physical Exam: BP 110/70   Pulse 81   Temp 98.1 F (36.7 C)   Resp 18   Ht 6\' 2"  (1.88 m)   Wt 280 lb 4 oz (127.1 kg)   SpO2 96%   BMI 35.98 kg/m    Physical Exam Vitals reviewed.  Constitutional:      Appearance: Normal appearance.  HENT:     Head: Normocephalic and atraumatic.     Right Ear: Tympanic membrane normal.     Left Ear: Tympanic membrane normal.     Nose:     Comments: Bilateral nares slightly erythematous with thin clear nasal drainage noted.  Pharynx normal.  Ears normal.  Eyes normal.    Mouth/Throat:     Pharynx: Oropharynx is clear.  Eyes:     Conjunctiva/sclera: Conjunctivae normal.  Cardiovascular:     Rate and Rhythm:  Normal rate and regular rhythm.     Heart sounds: Normal heart sounds. No murmur heard. Pulmonary:     Effort: Pulmonary effort is normal.     Breath sounds: Normal breath sounds.     Comments: Lungs clear to auscultation Musculoskeletal:        General: Normal range of motion.     Cervical back: Normal range of motion and neck supple.  Skin:    General: Skin is warm and dry.  Neurological:     General: No focal deficit present.     Mental Status: He is alert and oriented to person, place, and time.  Psychiatric:        Mood and Affect: Mood normal.        Behavior: Behavior normal.        Thought Content: Thought content normal.        Judgment: Judgment normal.     Assessment and Plan: 1. Seasonal and perennial allergic rhinitis     Meds ordered this encounter  Medications   azelastine (ASTELIN) 0.1 % nasal spray    Sig: Place 1-2 sprays into both nostrils 2 (two) times daily as needed (nasal drainage). Use in each nostril as directed    Dispense:  90 mL    Refill:  3   fluticasone (FLONASE) 50 MCG/ACT nasal spray    Sig: Place 1 spray  into both nostrils in the morning and at bedtime.    Dispense:  48 g    Refill:  3   montelukast (SINGULAIR) 10 MG tablet    Sig: Take 1 tablet (10 mg total) by mouth at bedtime.    Dispense:  90 tablet    Refill:  3   levocetirizine (XYZAL) 5 MG tablet    Sig: Take 1 tablet (5 mg total) by mouth daily as needed for allergies (Can take an extra dose during flare ups.).    Dispense:  180 tablet    Refill:  3   Olopatadine HCl (PATADAY) 0.2 % SOLN    Sig: Place 1 drop into both eyes 1 day or 1 dose.    Dispense:  7.5 mL    Refill:  3    Patient Instructions  Allergic rhinitis Continue allergen avoidance measures directed toward grass pollen, ragweed pollen weed pollen, tree pollen, cat, mold, and dust mite as listed below Continue Xyzal 5 mg once a day if needed for runny nose or itch Continue Flonase 2 sprays in each nostril once  a day if needed for stuffy nose Continue azelastine 2 sprays in each nostril up to twice a day if needed for runny nose heart nasal itching Continue montelukast 10 mg once a day to control allergy symptoms   Allergic conjunctivitis Some over the counter eye drops include Pataday one drop in each eye once a day as needed for red, itchy eyes OR Zaditor one drop in each eye twice a day as needed for red itchy eyes. Avoid eye drops that say red eye relief as they may contain medications that dry out your eyes.   Call the clinic if this treatment plan is not working well for you  Follow up in 1 year or sooner if needed.   Return in about 1 year (around 12/23/2024), or if symptoms worsen or fail to improve.    Thank you for the opportunity to care for this patient.  Please do not hesitate to contact me with questions.  Marinus Sic, FNP [facility] 

## 2023-12-23 NOTE — Patient Instructions (Signed)
 Allergic rhinitis Continue allergen avoidance measures directed toward grass pollen, ragweed pollen weed pollen, tree pollen, cat, mold, and dust mite as listed below Continue Xyzal 5 mg once a day if needed for runny nose or itch Continue Flonase 2 sprays in each nostril once a day if needed for stuffy nose Continue azelastine 2 sprays in each nostril up to twice a day if needed for runny nose heart nasal itching Continue montelukast 10 mg once a day to control allergy symptoms Allergen immunotherapy [id number]  Allergic conjunctivitis Some over the counter eye drops include Pataday one drop in each eye once a day as needed for red, itchy eyes OR Zaditor one drop in each eye twice a day as needed for red itchy eyes. Avoid eye drops that say red eye relief as they may contain medications that dry out your eyes.   Call the clinic if this treatment plan is not working well for you  Follow up in *** or sooner if needed.  Reducing Pollen Exposure The [employer] suggests the following steps to reduce your exposure to pollen during allergy seasons. Do not hang sheets or clothing out to dry; pollen may collect on these items. Do not mow lawns or spend time around freshly cut grass; mowing stirs up pollen. Keep windows closed at night.  Keep car windows closed while driving. Minimize morning activities outdoors, a time when pollen counts are usually at their highest. Stay indoors as much as possible when pollen counts or humidity is high and on windy days when pollen tends to remain in the air longer. Use air conditioning when possible.  Many air conditioners have filters that trap the pollen spores. Use a HEPA room air filter to remove pollen form the indoor air you breathe.  Control of Dog or Cat Allergen Avoidance is the best way to manage a dog or cat allergy. If you have a dog or cat and are allergic to dog or cats, consider removing  the dog or cat from the home. If you have a dog or cat but don't want to find it a new home, or if your family wants a pet even though someone in the household is allergic, here are some strategies that may help keep symptoms at bay:  Keep the pet out of your bedroom and restrict it to only a few rooms. Be advised that keeping the dog or cat in only one room will not limit the allergens to that room. Don't pet, hug or kiss the dog or cat; if you do, wash your hands with soap and water. High-efficiency particulate air (HEPA) cleaners run continuously in a bedroom or living room can reduce allergen levels over time. Regular use of a high-efficiency vacuum cleaner or a central vacuum can reduce allergen levels. Giving your dog or cat a bath at least once a week can reduce airborne allergen.  Control of Mold Allergen Mold and fungi can grow on a variety of surfaces provided certain temperature and moisture conditions exist.  Outdoor molds grow on plants, decaying vegetation and soil.  The major outdoor mold, Alternaria and Cladosporium, are found in very high numbers during hot and dry conditions.  Generally, a late Summer - Fall peak is seen for common outdoor fungal spores.  Rain will temporarily lower outdoor mold spore count, but counts rise rapidly when the rainy period ends.  The most important indoor molds are Aspergillus and Penicillium.  Dark, humid and poorly ventilated basements are  ideal sites for mold growth.  The next most common sites of mold growth are the bathroom and the kitchen.  Outdoor [employer] Use air conditioning and keep windows closed Avoid exposure to decaying vegetation. Avoid leaf raking. Avoid grain handling. Consider wearing a face mask if working in moldy areas.  Indoor Mold Control Maintain humidity below 50%. Clean washable surfaces with 5% bleach solution. Remove sources e.g. Contaminated carpets.   Control of Dust Mite Allergen Dust mites play a major role  in allergic asthma and rhinitis. They occur in environments with high humidity wherever human skin is found. Dust mites absorb humidity from the atmosphere (ie, they do not drink) and feed on organic matter (including shed human and animal skin). Dust mites are a microscopic type of insect that you cannot see with the naked eye. High levels of dust mites have been detected from mattresses, pillows, carpets, upholstered furniture, bed covers, clothes, soft toys and any woven material. The principal allergen of the dust mite is found in its feces. A gram of dust may contain 1,000 mites and 250,000 fecal particles. Mite antigen is easily measured in the air during house cleaning activities. Dust mites do not bite and do not cause harm to humans, other than by triggering allergies/asthma.  Ways to decrease your exposure to dust mites in your home:  1. Encase mattresses, box springs and pillows with a mite-impermeable barrier or cover  2. Wash sheets, blankets and drapes weekly in hot water (130 F) with detergent and dry them in a dryer on the hot setting.  3. Have the room cleaned frequently with a vacuum cleaner and a damp dust-mop. For carpeting or rugs, vacuuming with a vacuum cleaner equipped with a high-efficiency particulate air (HEPA) filter. The dust mite allergic individual should not be in a room which is being cleaned and should wait 1 hour after cleaning before going into the room.  4. Do not sleep on upholstered furniture (eg, couches).  5. If possible removing carpeting, upholstered furniture and drapery from the home is ideal. Horizontal blinds should be eliminated in the rooms where the person spends the most time (bedroom, study, television room). Washable vinyl, roller-type shades are optimal.  6. Remove all non-washable stuffed toys from the bedroom. Wash stuffed toys weekly like sheets and blankets above.  7. Reduce indoor humidity to less than 50%. Inexpensive humidity monitors can  be purchased at most hardware stores. Do not use a humidifier as can make the problem worse and are not recommended.

## 2023-12-24 ENCOUNTER — Encounter: Payer: Self-pay | Admitting: Family Medicine

## 2023-12-24 ENCOUNTER — Ambulatory Visit (INDEPENDENT_AMBULATORY_CARE_PROVIDER_SITE_OTHER): Payer: No Typology Code available for payment source | Admitting: Family Medicine

## 2023-12-24 VITALS — BP 110/70 | HR 81 | Temp 98.1°F | Resp 18 | Ht 74.0 in | Wt 280.2 lb

## 2023-12-24 DIAGNOSIS — J3089 Other allergic rhinitis: Secondary | ICD-10-CM | POA: Diagnosis not present

## 2023-12-24 DIAGNOSIS — J302 Other seasonal allergic rhinitis: Secondary | ICD-10-CM | POA: Diagnosis not present

## 2023-12-24 MED ORDER — OLOPATADINE HCL 0.2 % OP SOLN: 1.0000 [drp] | OPHTHALMIC | 3 refills | Status: AC

## 2023-12-24 MED ORDER — AZELASTINE HCL 0.1 % NA SOLN: 1.0000 | Freq: Two times a day (BID) | NASAL | 3 refills | Status: AC | PRN

## 2023-12-24 MED ORDER — LEVOCETIRIZINE DIHYDROCHLORIDE 5 MG PO TABS: 5.0000 mg | ORAL_TABLET | Freq: Every day | ORAL | 3 refills | Status: AC | PRN

## 2023-12-24 MED ORDER — FLUTICASONE PROPIONATE 50 MCG/ACT NA SUSP: 1.0000 | Freq: Two times a day (BID) | NASAL | 3 refills | Status: AC

## 2023-12-24 MED ORDER — MONTELUKAST SODIUM 10 MG PO TABS: 10.0000 mg | ORAL_TABLET | Freq: Every day | ORAL | 3 refills | Status: AC

## 2024-05-13 NOTE — Telephone Encounter (Signed)
 Referral has been received and added to the chart

## 2024-12-24 ENCOUNTER — Ambulatory Visit: Admitting: Allergy & Immunology
